# Patient Record
Sex: Female | Born: 2000 | Hispanic: Yes | Marital: Single
Health system: Southern US, Community
[De-identification: ages and names within clinical notes are randomized; demographics above are authoritative.]

## PROBLEM LIST (undated history)

## (undated) DIAGNOSIS — F332 Major depressive disorder, recurrent severe without psychotic features: Secondary | ICD-10-CM

---
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
## 2021-04-08 ENCOUNTER — Emergency Department (HOSPITAL_COMMUNITY)
Admission: EM | Admit: 2021-04-08 | Discharge: 2021-04-08 | Disposition: A | Discharge status: Home / Self Care | Payer: Medicaid Other | Attending: Emergency Medicine | Admitting: Emergency Medicine

## 2021-04-08 ENCOUNTER — Emergency Department (HOSPITAL_COMMUNITY): Payer: Medicaid Other

## 2021-04-08 ENCOUNTER — Encounter (HOSPITAL_COMMUNITY): Payer: Self-pay | Admitting: Emergency Medicine

## 2021-04-08 ENCOUNTER — Other Ambulatory Visit: Payer: Self-pay

## 2021-04-08 DIAGNOSIS — R17 Unspecified jaundice: Secondary | ICD-10-CM | POA: Diagnosis not present

## 2021-04-08 DIAGNOSIS — K829 Disease of gallbladder, unspecified: Secondary | ICD-10-CM | POA: Insufficient documentation

## 2021-04-08 DIAGNOSIS — R7401 Elevation of levels of liver transaminase levels: Secondary | ICD-10-CM | POA: Insufficient documentation

## 2021-04-08 DIAGNOSIS — R1011 Right upper quadrant pain: Secondary | ICD-10-CM | POA: Diagnosis present

## 2021-04-08 LAB — LIPASE, BLOOD: Lipase: 31 U/L (ref 11–51)

## 2021-04-08 LAB — CBC
HCT: 41.2 % (ref 36.0–46.0)
Hemoglobin: 13.2 g/dL (ref 12.0–15.0)
MCH: 26.5 pg (ref 26.0–34.0)
MCHC: 32 g/dL (ref 30.0–36.0)
MCV: 82.7 fL (ref 80.0–100.0)
Platelets: 307 10*3/uL (ref 150–400)
RBC: 4.98 MIL/uL (ref 3.87–5.11)
RDW: 13.2 % (ref 11.5–15.5)
WBC: 8 10*3/uL (ref 4.0–10.5)
nRBC: 0 % (ref 0.0–0.2)

## 2021-04-08 LAB — COMPREHENSIVE METABOLIC PANEL
ALT: 343 U/L — ABNORMAL HIGH (ref 0–44)
AST: 159 U/L — ABNORMAL HIGH (ref 15–41)
Albumin: 4.3 g/dL (ref 3.5–5.0)
Alkaline Phosphatase: 230 U/L — ABNORMAL HIGH (ref 38–126)
Anion gap: 9 (ref 5–15)
BUN: 7 mg/dL (ref 6–20)
CO2: 25 mmol/L (ref 22–32)
Calcium: 9.2 mg/dL (ref 8.9–10.3)
Chloride: 100 mmol/L (ref 98–111)
Creatinine, Ser: 0.53 mg/dL (ref 0.44–1.00)
GFR, Estimated: >60 mL/min (ref >60)
Glucose, Bld: 104 mg/dL — ABNORMAL HIGH (ref 70–99)
Potassium: 3.8 mmol/L (ref 3.5–5.1)
Sodium: 134 mmol/L — ABNORMAL LOW (ref 135–145)
Total Bilirubin: 2.8 mg/dL — ABNORMAL HIGH (ref 0.3–1.2)
Total Protein: 8.1 g/dL (ref 6.5–8.1)

## 2021-04-08 LAB — I-STAT BETA HCG BLOOD, ED (MC, WL, AP ONLY): I-stat hCG, quantitative: <5 m[IU]/mL (ref <5)

## 2021-04-08 MED ORDER — ONDANSETRON HCL 4 MG/2ML IJ SOLN
4.0000 mg | Freq: Once | INTRAMUSCULAR | Status: AC
  Administered 2021-04-08: 4 mg via INTRAVENOUS
  Filled 2021-04-08: qty 2

## 2021-04-08 MED ORDER — HYDROCODONE-ACETAMINOPHEN 5-325 MG PO TABS: 1.0000 | ORAL_TABLET | ORAL | 0 refills | Status: DC | PRN

## 2021-04-08 MED ORDER — SODIUM CHLORIDE 0.9 % IV SOLN
INTRAVENOUS | Status: DC
Start: 2021-04-08 — End: 2021-04-09

## 2021-04-08 MED ORDER — HYDROCODONE-ACETAMINOPHEN 5-325 MG PO TABS
1.0000 | ORAL_TABLET | Freq: Once | ORAL | Status: AC
  Administered 2021-04-08: 1 via ORAL
  Filled 2021-04-08: qty 1

## 2021-04-08 MED ORDER — FENTANYL CITRATE PF 50 MCG/ML IJ SOSY
50.0000 ug | PREFILLED_SYRINGE | Freq: Once | INTRAMUSCULAR | Status: AC
  Administered 2021-04-08: 50 ug via INTRAVENOUS
  Filled 2021-04-08: qty 1

## 2021-04-08 NOTE — ED Triage Notes (Signed)
Pt reports issues with her gallbladder. Pt reports being seen in [place] for same and referred to follow up with surgeon. Pt reports pain unbearable to wait at this time ?

## 2021-04-08 NOTE — Discharge Instructions (Addendum)
The testing indicates that you have gallstones and some mild liver inflammation.  We are giving you a prescription for pain medicine to help treat your symptoms.  Try to decrease fat intake in your diet which will help decrease episodes of gallbladder pain.  Follow-up with Dr. Dema Severin as scheduled on April 14, 2021, for further care and treatment of the gallbladder disease.  Return here, if needed for problems. ?

## 2021-04-08 NOTE — ED Provider Notes (Signed)
?[place] DEPT ?Provider Note ? ? ?CSN: [id number] ?Arrival date & time: 04/08/21  1519 ? ?  ? ?History ? ?Chief Complaint  ?Patient presents with  ? Abdominal Pain  ? ? ?Samantha Edwards is a 21 y.o. female. ? ?HPI ?She reports right upper quadrant abdominal pain present for 2 months and worsening despite taking hydrocodone prescribed 2 months ago.  She states at that time she was told she has stones in her gallbladder.  She has not seen a surgeon yet for operative management.  She states she has had this problem for quite some time, before the episode 2 months ago.  She denies fever, cough, diarrhea or dizziness.  She is had trouble tolerating food and had multiple episodes of vomiting over the last 4 days.  She has no other current complaints.  She has a follow-up appointment scheduled general surgery for 04/14/2021. ?  ? ?Home Medications ?Prior to Admission medications   ?Medication Sig Start Date End Date Taking? Authorizing Provider  ?HYDROcodone-acetaminophen (NORCO) 5-325 MG tablet Take 1 tablet by mouth every 4 (four) hours as needed for moderate pain. 04/08/21  Yes Daleen Bo, MD  ?   ? ?Allergies    ?Patient has no allergy information on record.   ? ?Review of Systems   ?Review of Systems ? ?Physical Exam ?Updated Vital Signs ?BP (!) 96/53   Pulse 75   Temp 98.6 ?F (37 ?C) (Oral)   Resp 14   SpO2 99%  ?Physical Exam ?Vitals and nursing note reviewed.  ?Constitutional:   ?   Appearance: She is well-developed. She is not ill-appearing.  ?HENT:  ?   Head: Normocephalic and atraumatic.  ?   Right Ear: External ear normal.  ?   Left Ear: External ear normal.  ?Eyes:  ?   Conjunctiva/sclera: Conjunctivae normal.  ?   Pupils: Pupils are equal, round, and reactive to light.  ?Neck:  ?   Trachea: Phonation normal.  ?Cardiovascular:  ?   Rate and Rhythm: Normal rate and regular rhythm.  ?   Heart sounds: Normal heart sounds.  ?Pulmonary:  ?   Effort: Pulmonary effort is  normal.  ?   Breath sounds: Normal breath sounds.  ?Abdominal:  ?   General: There is no distension.  ?   Palpations: Abdomen is soft.  ?   Tenderness: There is abdominal tenderness (Right upper quadrant, mild).  ?Musculoskeletal:     ?   General: Normal range of motion.  ?   Cervical back: Normal range of motion and neck supple.  ?Skin: ?   General: Skin is warm and dry.  ?Neurological:  ?   Mental Status: She is alert and oriented to person, place, and time.  ?   Cranial Nerves: No cranial nerve deficit.  ?   Sensory: No sensory deficit.  ?   Motor: No abnormal muscle tone.  ?   Coordination: Coordination normal.  ?Psychiatric:     ?   Mood and Affect: Mood normal.     ?   Behavior: Behavior normal.     ?   Thought Content: Thought content normal.     ?   Judgment: Judgment normal.  ? ? ?ED Results / Procedures / Treatments   ?Labs ?(all labs ordered are listed, but only abnormal results are displayed) ?Labs Reviewed  ?COMPREHENSIVE METABOLIC PANEL - Abnormal; Notable for the following components:  ?    Result Value  ? Sodium 134 (*)   ?  Glucose, Bld 104 (*)   ? AST 159 (*)   ? ALT 343 (*)   ? Alkaline Phosphatase 230 (*)   ? Total Bilirubin 2.8 (*)   ? All other components within normal limits  ?LIPASE, BLOOD  ?CBC  ?URINALYSIS, ROUTINE W REFLEX MICROSCOPIC  ?I-STAT BETA HCG BLOOD, ED ([facility], [facility], [facility] ONLY)  ? ? ?EKG ?None ? ?Radiology ?US Abdomen Complete ? ?Result Date: 04/08/2021 ?CLINICAL DATA:  Pain, vomiting EXAM: ABDOMEN ULTRASOUND COMPLETE COMPARISON:  Ultrasound 04/06/2021 FINDINGS: Gallbladder: There are multiple intraluminal gallstones, largest measuring up to 6 mm. The gallbladder is mildly distended. No wall thickening or pericholecystic fluid. A positive sonographic Percell Miller sign is reported by the sonographer. Common bile duct: Diameter: 14 mm, abnormal. No intraductal obstructing lesion identified. Liver: No focal lesion identified. Within normal limits in parenchymal echogenicity. Portal vein is patent  on color Doppler imaging with normal direction of blood flow towards the liver. IVC: No abnormality visualized. Pancreas: Visualized portion unremarkable. Spleen: Size and appearance within normal limits. Right Kidney: Length: 9.4. Echogenicity within normal limits. No mass or hydronephrosis visualized. Left Kidney: Length: 8 point. Echogenicity within normal limits. No mass or hydronephrosis visualized. Abdominal aorta: No aneurysm visualized. Other findings: Limited exam due to patient position and pain. IMPRESSION: Dilated common bile duct measuring up to 14 mm without intraductal obstructing lesion visible sonographically. There are gallstones within the gallbladder. Recommend correlation with LFTs for findings suggesting biliary obstruction and consideration of MRCP/ERCP. A positive sonographic Percell Miller sign was reported by the sonographer, however the gallbladder is only mildly distended and there is no wall thickening or pericholecystic fluid. Acute cholecystitis is unlikely based on the sonographic appearance. Electronically Signed   By: Maurine Simmering M.D.   On: 04/08/2021 17:12   ? ?Procedures ?Procedures  ? ? ?Medications Ordered in ED ?Medications  ?0.9 %  sodium chloride infusion ( Intravenous New Bag/Given 04/08/21 1722)  ?HYDROcodone-acetaminophen (NORCO/VICODIN) 5-325 MG per tablet 1 tablet (has no administration in time range)  ?fentaNYL (SUBLIMAZE) injection 50 mcg (50 mcg Intravenous Given 04/08/21 1712)  ?ondansetron [facility]) injection 4 mg (4 mg Intravenous Given 04/08/21 1711)  ? ? ?ED Course/ Medical Decision Making/ A&P ?Clinical Course as of 04/08/21 2240  ?Wed Apr 08, 2021  ?2152 Case discussed with Dr. Rosendo Gros from general surgery.  He recommends outpatient follow-up based on findings and improvement after treatment [EW]  ?  ?Clinical Course User Index ?[EW] Daleen Bo, MD  ? ?                        ?Medical Decision Making ?Patient presenting with chronic right upper quadrant abdominal pain  now with vomiting and increased pain, intolerance to food.  She thinks that she has gallstones. ? ?Problems Addressed: ?Right upper quadrant abdominal pain: chronic illness or injury ?   Details: Evaluation consistent with gallbladder complications ? ?Amount and/or Complexity of Data Reviewed ?Independent Historian:  ?   Details: She is a cogent historian ?Labs: ordered. ?   Details: CBC, c-Met, lipase ?Radiology: ordered and independent interpretation performed. ?   Details: Abdominal ultrasound ?Discussion of management or test interpretation with external provider(s): Case discussed with general surgery, Dr. Rosendo Gros.  He recommends outpatient follow-up for gallbladder disease.  He does not think that the ultrasound findings require further intervention at this time. ? ?Risk ?Prescription drug management. ?Decision regarding hospitalization. ?Risk Details: Patient with chronic symptoms of gallstones, without signs for cholecystitis or significant inflammatory  process that would require hospitalization or further interventions at this time.  Patient is comfortable after treatment in the ED and stable for discharge.  She already has a follow-up appointment scheduled for next week. ? ? ? ? ? ? ? ? ? ? ?Final Clinical Impression(s) / ED Diagnoses ?Final diagnoses:  ?Right upper quadrant abdominal pain  ?Gallbladder disease  ?Transaminitis  ?Hyperbilirubinemia  ? ? ?Rx / DC Orders ?ED Discharge Orders   ? ?      Ordered  ?  HYDROcodone-acetaminophen (NORCO) 5-325 MG tablet  Every 4 hours PRN       ? 04/08/21 2225  ? ?  ?  ? ?  ? ? ?  ?Daleen Bo, MD ?04/08/21 2241 ? ?

## 2021-06-18 HISTORY — PX: CHOLECYSTECTOMY: SHX55

## 2021-12-25 ENCOUNTER — Ambulatory Visit: Payer: Medicaid Other

## 2022-01-01 ENCOUNTER — Ambulatory Visit (INDEPENDENT_AMBULATORY_CARE_PROVIDER_SITE_OTHER): Payer: Medicaid Other

## 2022-01-01 VITALS — BP 110/70 | HR 80 | Wt 155.0 lb

## 2022-01-01 DIAGNOSIS — Z3201 Encounter for pregnancy test, result positive: Secondary | ICD-10-CM

## 2022-01-01 DIAGNOSIS — N912 Amenorrhea, unspecified: Secondary | ICD-10-CM

## 2022-01-01 DIAGNOSIS — Z3401 Encounter for supervision of normal first pregnancy, first trimester: Secondary | ICD-10-CM

## 2022-01-01 MED ORDER — VITAFOL GUMMIES 3.33-0.333-34.8 MG PO CHEW: 1.0000 | CHEWABLE_TABLET | Freq: Every day | ORAL | 12 refills | Status: DC

## 2022-01-01 NOTE — Progress Notes (Addendum)
Ms. Bahena presents today for UPT. She has no unusual complaints. LMP: 11/21/21     OBJECTIVE: Appears well, in no apparent distress.  OB History     Gravida  1   Para      Term      Preterm      AB      Living         SAB      IAB      Ectopic      Multiple      Live Births             Home UPT Result: positive  In-Office UPT result:positive  I have reviewed the patient's medical, obstetrical, social, and family histories, and medications.   ASSESSMENT: Positive pregnancy test  PLAN Prenatal care to be completed at:  [facility]  Enroll in Babyscripts  PNV's sent to the pharmacy today.

## 2022-01-18 NOTE — L&D Delivery Note (Signed)
Patient complete and pushing. SVD of viable female infant over intact perineum. Infant delivered to mom's abdomen. Delayed cord clamping x 1 minute. Cord clamped x 2, cut. Spontaneous cry heard. Weight and Apgars pending. Cord blood obtained. Placenta delivered spontaneously and intact. LUS cleared of clot Vagina inspected.1st degree and left peri-urethral lacerations noted. Repaired with 3-0 Vicryl with  a figure of eight in each location EBL: 277 cc Anesthesia: local

## 2022-02-05 ENCOUNTER — Ambulatory Visit (INDEPENDENT_AMBULATORY_CARE_PROVIDER_SITE_OTHER): Payer: Medicaid Other

## 2022-02-05 ENCOUNTER — Other Ambulatory Visit (HOSPITAL_COMMUNITY)
Admission: RE | Admit: 2022-02-05 | Discharge: 2022-02-05 | Disposition: A | Discharge status: Home / Self Care | Payer: Medicaid Other | Source: Ambulatory Visit | Attending: Obstetrics | Admitting: Obstetrics

## 2022-02-05 VITALS — BP 100/66 | HR 101 | Ht 61.0 in | Wt 152.4 lb

## 2022-02-05 DIAGNOSIS — Z1332 Encounter for screening for maternal depression: Secondary | ICD-10-CM | POA: Diagnosis not present

## 2022-02-05 DIAGNOSIS — Z3481 Encounter for supervision of other normal pregnancy, first trimester: Secondary | ICD-10-CM

## 2022-02-05 DIAGNOSIS — Z3480 Encounter for supervision of other normal pregnancy, unspecified trimester: Secondary | ICD-10-CM | POA: Insufficient documentation

## 2022-02-05 DIAGNOSIS — Z23 Encounter for immunization: Secondary | ICD-10-CM

## 2022-02-05 DIAGNOSIS — O3680X0 Pregnancy with inconclusive fetal viability, not applicable or unspecified: Secondary | ICD-10-CM

## 2022-02-05 DIAGNOSIS — Z3A12 12 weeks gestation of pregnancy: Secondary | ICD-10-CM

## 2022-02-05 DIAGNOSIS — Z3A15 15 weeks gestation of pregnancy: Secondary | ICD-10-CM

## 2022-02-05 MED ORDER — PROMETHAZINE HCL 25 MG PO TABS: 25.0000 mg | ORAL_TABLET | Freq: Four times a day (QID) | ORAL | 2 refills | Status: DC | PRN

## 2022-02-05 MED ORDER — VITAFOL GUMMIES 3.33-0.333-34.8 MG PO CHEW: 3.0000 | CHEWABLE_TABLET | Freq: Every day | ORAL | 11 refills | Status: DC

## 2022-02-05 MED ORDER — BLOOD PRESSURE KIT DEVI: 1.0000 | 0 refills | Status: DC

## 2022-02-05 NOTE — Progress Notes (Signed)
New OB Intake  I connected with Samantha Edwards  on 02/05/22 at 10:15 AM EST by in person and verified that I am speaking with the correct person using two identifiers. Nurse is located at [facility] and pt is located at [place].  I discussed the limitations, risks, security and privacy concerns of performing an evaluation and management service by telephone and the availability of in person appointments. I also discussed with the patient that there may be a patient responsible charge related to this service. The patient expressed understanding and agreed to proceed.  I explained I am completing New OB Intake today. We discussed EDD of 08/15/22 that is based on first trimester u/s. Pt is G3/P1011. I reviewed her allergies, medications, Medical/Surgical/OB history, and appropriate screenings. I informed her of [facility] services. [facility] information placed in AVS. Based on history, this is a low risk pregnancy.  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 Patient given information for [facility] Healthy Baby website for more information about [facility] and [place]. Patient is interested in water birth. Offered upcoming OB visit with CNM to discuss further.  MyChart/Babyscripts MyChart access verified. I explained pt will have some visits in office and some virtually. Babyscripts instructions given and order placed. Patient verifies receipt of registration text/e-mail. Account successfully created and app downloaded.  Blood Pressure Cuff/Weight Scale Blood pressure cuff ordered for  patient to pick-up from [employer]. Explained after first prenatal appt pt will check weekly and document in 59. Patient does have weight scale.  Anatomy US Explained first scheduled [place] will be around 19 weeks. Dating and viability u/s performed today. Anatomy US TBD.  Labs Discussed Johnsie Cancel genetic screening with patient. Would like both Panorama and Horizon drawn at new OB visit. Routine prenatal labs needed.  COVID Vaccine Patient has not had COVID vaccine.   Is patient a CenteringPregnancy candidate?  Declined Declined due to Group setting Not a candidate due to  If accepted,   Social Determinants of Health Food Insecurity: Patient denies food insecurity. WIC Referral: Patient is interested in referral to [facility].  Transportation: Patient denies transportation needs. Childcare: Discussed no children allowed at ultrasound appointments. Offered childcare services; patient declines childcare services at this time.  First visit review I reviewed new OB appt with patient. I explained they will have a provider visit that includes pap smear and discuss plan of care for pregnancy. Explained pt will be seen by Gale Journey at first visit; encounter routed to appropriate provider. Explained that patient will be seen by pregnancy navigator following visit with provider.   Lucianne Lei, RN 02/05/2022  10:14 AM

## 2022-02-05 NOTE — Addendum Note (Signed)
Addended by: Lucianne Lei on: 02/05/2022 11:31 AM   Modules accepted: Orders

## 2022-02-07 LAB — CULTURE, OB URINE

## 2022-02-07 LAB — URINE CULTURE, OB REFLEX

## 2022-02-08 LAB — CERVICOVAGINAL ANCILLARY ONLY
Chlamydia: NEGATIVE
Comment: NEGATIVE
Comment: NEGATIVE
Comment: NORMAL
Neisseria Gonorrhea: NEGATIVE
Trichomonas: NEGATIVE

## 2022-02-10 LAB — CBC/D/PLT+RPR+RH+ABO+RUBIGG...
Antibody Screen: NEGATIVE
Basophils Absolute: 0 10*3/uL (ref 0.0–0.2)
Basos: 0 %
EOS (ABSOLUTE): 0.1 10*3/uL (ref 0.0–0.4)
Eos: 1 %
HCV Ab: NONREACTIVE
HIV Screen 4th Generation wRfx: NONREACTIVE
Hematocrit: 39.9 % (ref 34.0–46.6)
Hemoglobin: 12.7 g/dL (ref 11.1–15.9)
Hepatitis B Surface Ag: NEGATIVE
Immature Grans (Abs): 0 10*3/uL (ref 0.0–0.1)
Immature Granulocytes: 0 %
Lymphocytes Absolute: 1.4 10*3/uL (ref 0.7–3.1)
Lymphs: 20 %
MCH: 24.9 pg — ABNORMAL LOW (ref 26.6–33.0)
MCHC: 31.8 g/dL (ref 31.5–35.7)
MCV: 78 fL — ABNORMAL LOW (ref 79–97)
Monocytes Absolute: 0.3 10*3/uL (ref 0.1–0.9)
Monocytes: 4 %
Neutrophils Absolute: 5.5 10*3/uL (ref 1.4–7.0)
Neutrophils: 75 %
Platelets: 306 10*3/uL (ref 150–450)
RBC: 5.11 x10E6/uL (ref 3.77–5.28)
RDW: 15.2 % (ref 11.7–15.4)
RPR Ser Ql: REACTIVE — AB
Rh Factor: POSITIVE
Rubella Antibodies, IGG: 2.12 index (ref >0.99)
WBC: 7.3 10*3/uL (ref 3.4–10.8)

## 2022-02-10 LAB — HCV INTERPRETATION

## 2022-02-10 LAB — RPR, QUANT+TP ABS (REFLEX)
Rapid Plasma Reagin, Quant: 1:64 {titer} — ABNORMAL HIGH
T Pallidum Abs: NONREACTIVE

## 2022-02-11 LAB — PANORAMA PRENATAL TEST FULL PANEL:PANORAMA TEST PLUS 5 ADDITIONAL MICRODELETIONS: FETAL FRACTION: 9.8

## 2022-02-17 LAB — HORIZON CUSTOM: REPORT SUMMARY: NEGATIVE

## 2022-02-23 ENCOUNTER — Other Ambulatory Visit (HOSPITAL_COMMUNITY)
Admission: RE | Admit: 2022-02-23 | Discharge: 2022-02-23 | Disposition: A | Discharge status: Home / Self Care | Payer: Medicaid Other | Source: Ambulatory Visit | Attending: Obstetrics and Gynecology | Admitting: Obstetrics and Gynecology

## 2022-02-23 ENCOUNTER — Encounter: Payer: Self-pay | Admitting: Obstetrics and Gynecology

## 2022-02-23 ENCOUNTER — Ambulatory Visit (INDEPENDENT_AMBULATORY_CARE_PROVIDER_SITE_OTHER): Payer: Medicaid Other | Admitting: Obstetrics and Gynecology

## 2022-02-23 VITALS — BP 103/66 | HR 91 | Wt 154.0 lb

## 2022-02-23 DIAGNOSIS — Z124 Encounter for screening for malignant neoplasm of cervix: Secondary | ICD-10-CM | POA: Insufficient documentation

## 2022-02-23 DIAGNOSIS — Z3482 Encounter for supervision of other normal pregnancy, second trimester: Secondary | ICD-10-CM | POA: Diagnosis not present

## 2022-02-23 DIAGNOSIS — A530 Latent syphilis, unspecified as early or late: Secondary | ICD-10-CM | POA: Diagnosis not present

## 2022-02-23 DIAGNOSIS — Z8659 Personal history of other mental and behavioral disorders: Secondary | ICD-10-CM | POA: Diagnosis not present

## 2022-02-23 DIAGNOSIS — Z3480 Encounter for supervision of other normal pregnancy, unspecified trimester: Secondary | ICD-10-CM

## 2022-02-23 DIAGNOSIS — Z3A15 15 weeks gestation of pregnancy: Secondary | ICD-10-CM

## 2022-02-23 NOTE — Progress Notes (Signed)
Subjective:   Samantha Edwards is a 22 y.o. G3P1011 at [redacted]w[redacted]d by early ultrasound being seen today for her first obstetrical visit.  Her obstetrical history is significant for  n/a . Patient does intend to breast feed. Pregnancy history fully reviewed.  Patient reports she is doing well overall. Nervous about her +RPR.  Discussed her history of depression with a suicide attempt by drug overdose. She reports that this occurred around 2018. She no longer follows with psychiatry/therapist and has not been on an SSRI for years. She reports that her mood has been good and she has had no issues with depression.  Denies any complications with her previous pregnancy, labor or delivery.  HISTORY: OB History  Gravida Para Term Preterm AB Living  3 1 1  0 1 1  SAB IAB Ectopic Multiple Live Births  1 0 0 0 1    # Outcome Date GA Lbr Len/2nd Weight Sex Delivery Anes PTL Lv  3 Current           2 SAB 05/2021          1 Term 12/07/20   6 lb 5 oz (2.863 kg) F Vag-Spont   LIV    Last pap smear: never  Past Medical History:  Diagnosis Date   MDD (major depressive disorder), recurrent severe, without psychosis ([place]) 11/09/2016   Suicide attempt by drug ingestion ([place]) 11/08/2016   Past Surgical History:  Procedure Laterality Date   CHOLECYSTECTOMY  06/2021   No family history on file. Social History   Tobacco Use   Smoking status: Never    Passive exposure: Never   Smokeless tobacco: Never  Vaping Use   Vaping Use: Never used  Substance Use Topics   Alcohol use: Not Currently    Comment: not since confirmed pregnancy   Drug use: Not Currently    Types: Marijuana    Comment: not since confirmed pregnancy   No Known Allergies Current Outpatient Medications on File Prior to Visit  Medication Sig Dispense Refill   Blood Pressure Monitoring (BLOOD PRESSURE KIT) DEVI 1 kit by Does not apply route once a week. 1 each 0   Prenatal Vit-Fe Phos-FA-Omega (VITAFOL GUMMIES)  3.33-0.333-34.8 MG CHEW Chew 3 tablets by mouth daily. 90 tablet 11   promethazine (PHENERGAN) 25 MG tablet Take 1 tablet (25 mg total) by mouth every 6 (six) hours as needed for nausea or vomiting. 30 tablet 2   No current facility-administered medications on file prior to visit.   Exam   Vitals:   02/23/22 1523  BP: 103/66  Pulse: 91  Weight: 154 lb (69.9 kg)   Fetal Heart Rate (bpm): 160  General:  Alert, oriented and cooperative. Patient is in no acute distress.  Breast: Deferred  Cardiovascular: Normal heart rate noted  Respiratory: Normal respiratory effort, no problems with respiration noted  Abdomen: Soft, gravid, appropriate for gestational age.  Pain/Pressure: Absent     Pelvic: Performed in presence of a chaperone. NEFG. Cervical ectropion. Normal cervix.         Extremities: Normal range of motion.  Edema: None  Mental Status: Normal mood and affect. Normal behavior. Normal judgment and thought content.    Assessment:   Pregnancy: G3P1011 Patient Active Problem List   Diagnosis Date Noted   Positive RPR test 02/23/2022   Supervision of other normal pregnancy, antepartum 02/05/2022   History of depression with suicide attempt 11/08/2016   Plan:  Supervision of other normal pregnancy, antepartum  Initial labs previously drawn. Reviewed results with patient today. Continue prenatal vitamins. Genetic Screening discussed:NIPS/Horizon already completed and normal. Declined AFP today Ultrasound discussed; fetal anatomic survey: ordered. Problem list reviewed and updated. The nature of [place] with multiple MDs and other Advanced Practice Providers was explained to patient; also emphasized that residents, students are part of our team. Routine obstetric precautions reviewed. -     [place] MFM OB COMP + 14 WK; Future  2. Cervical cancer screening -     Cytology - PAP( [place])  3. Positive RPR test Negative TPPA. Office  contacted by state surveillance and repeat RPR was recommended. Ordered today. -     RPR  4. History of depression No current medications, not following with psych/therapist Discussed elevated risk of antenatal & postpartum depression. Discussed options for [facility] support. Pt able to identify concerning symptoms and when to call our office  Return in about 4 weeks (around 03/23/2022) for return OB & anatomy US.  Gale Journey, MD [place], [facility] for [employer], [place]

## 2022-02-23 NOTE — Progress Notes (Signed)
Patient presents for NOB. Needs repeat RPR per state.  Pap due today.  Declines AFP.

## 2022-02-24 LAB — RPR: RPR Ser Ql: NONREACTIVE

## 2022-02-26 LAB — CYTOLOGY - PAP: Diagnosis: NEGATIVE

## 2022-03-23 ENCOUNTER — Encounter: Payer: Medicaid Other | Admitting: Obstetrics and Gynecology

## 2022-03-24 ENCOUNTER — Ambulatory Visit: Payer: Medicaid Other | Admitting: *Deleted

## 2022-03-24 ENCOUNTER — Ambulatory Visit: Payer: Medicaid Other | Attending: Obstetrics and Gynecology

## 2022-03-24 ENCOUNTER — Encounter: Payer: Self-pay | Admitting: *Deleted

## 2022-03-24 VITALS — BP 112/59 | HR 73

## 2022-03-24 DIAGNOSIS — Z3A19 19 weeks gestation of pregnancy: Secondary | ICD-10-CM | POA: Diagnosis not present

## 2022-03-24 DIAGNOSIS — Z3689 Encounter for other specified antenatal screening: Secondary | ICD-10-CM | POA: Insufficient documentation

## 2022-03-24 DIAGNOSIS — Z3480 Encounter for supervision of other normal pregnancy, unspecified trimester: Secondary | ICD-10-CM | POA: Diagnosis present

## 2022-03-24 DIAGNOSIS — Z363 Encounter for antenatal screening for malformations: Secondary | ICD-10-CM | POA: Diagnosis not present

## 2022-03-25 ENCOUNTER — Ambulatory Visit (INDEPENDENT_AMBULATORY_CARE_PROVIDER_SITE_OTHER): Payer: Medicaid Other | Admitting: Obstetrics and Gynecology

## 2022-03-25 ENCOUNTER — Encounter: Payer: Self-pay | Admitting: Obstetrics and Gynecology

## 2022-03-25 VITALS — BP 110/62 | HR 87 | Wt 158.2 lb

## 2022-03-25 DIAGNOSIS — R040 Epistaxis: Secondary | ICD-10-CM

## 2022-03-25 DIAGNOSIS — Z3480 Encounter for supervision of other normal pregnancy, unspecified trimester: Secondary | ICD-10-CM

## 2022-03-25 DIAGNOSIS — Z3482 Encounter for supervision of other normal pregnancy, second trimester: Secondary | ICD-10-CM

## 2022-03-25 DIAGNOSIS — Z3A19 19 weeks gestation of pregnancy: Secondary | ICD-10-CM

## 2022-03-25 MED ORDER — PRENATAL VITAMINS 28-0.8 MG PO TABS: 1.0000 | ORAL_TABLET | Freq: Every day | ORAL | 3 refills | Status: DC

## 2022-03-25 NOTE — Progress Notes (Signed)
   LOW-RISK PREGNANCY OFFICE VISIT Patient name: Samantha Edwards MRN [id number]  Date of birth: December 19, 2000 Chief Complaint:   Routine Prenatal Visit  History of Present Illness:   Samantha Edwards is a 22 y.o. G92P1011 female at 66w4dwith an Estimated Date of Delivery: 08/15/22 being seen today for ongoing management of a low-risk pregnancy.  Today she reports  nose bleeds . Contractions: Not present. Vag. Bleeding: None.  Movement: Present. denies leaking of fluid. Review of Systems:   Pertinent items are noted in HPI Denies abnormal vaginal discharge w/ itching/odor/irritation, headaches, visual changes, shortness of breath, chest pain, abdominal pain, severe nausea/vomiting, or problems with urination or bowel movements unless otherwise stated above. Pertinent History Reviewed:  Reviewed past medical,surgical, social, obstetrical and family history.  Reviewed problem list, medications and allergies. Physical Assessment:   Vitals:   03/25/22 1556  BP: 110/62  Pulse: 87  Weight: 158 lb 3.2 oz (71.8 kg)  Body mass index is 29.89 kg/m.        Physical Examination:   General appearance: Well appearing, and in no distress  Mental status: Alert, oriented to person, place, and time  Skin: Warm & dry  Cardiovascular: Normal heart rate noted  Respiratory: Normal respiratory effort, no distress  Abdomen: Soft, gravid, nontender  Pelvic: Cervical exam deferred         Extremities: Edema: None  Fetal Status: Fetal Heart Rate (bpm): 154   Movement: Present    No results found for this or any previous visit (from the past 24 hour(s)).  Assessment & Plan:  1) Low-risk pregnancy G3P1011 at 15w4dith an Estimated Date of Delivery: 08/15/22   2) Supervision of other normal pregnancy, antepartum - Rx: Prenatal Vit-Fe Fumarate-FA (PRENATAL VITAMINS) 28-0.8 MG TABS; Take 1 tablet by mouth daily.  Dispense: 60 tablet; Refill: 3  3) Frequent epistaxis - Advised can be a normal  variation of pregnancy - Advised when occurs to hold head forward and not back, pinch bridge of nose and rub keys down the back of neck to help constrict blood vessels in nose  4) [redacted] weeks gestation of pregnancy     Meds:  Meds ordered this encounter  Medications   Prenatal Vit-Fe Fumarate-FA (PRENATAL VITAMINS) 28-0.8 MG TABS    Sig: Take 1 tablet by mouth daily.    Dispense:  60 tablet    Refill:  3    Order Specific Question:   Supervising Provider    Answer:   PRDonnamae [id number] Labs/procedures today: none  Plan:  Continue routine obstetrical care   Reviewed: Preterm labor symptoms and general obstetric precautions including but not limited to vaginal bleeding, contractions, leaking of fluid and fetal movement were reviewed in detail with the patient.  All questions were answered. Has home bp cuff. Check bp weekly, let usKoreanow if >140/90.   Follow-up: Return in about 4 weeks (around 04/22/2022) for Return OB visit.  No orders of the defined types were placed in this encounter.  RoLaury DeepSN, CNM 03/25/2022 4:35 PM

## 2022-03-25 NOTE — Progress Notes (Signed)
Pt presents for ROB visit. Pt c/o nose bleeds. Pt requesting prenatal tablets instead of gummies. Declines AFP testing.

## 2022-04-22 ENCOUNTER — Encounter: Payer: Self-pay | Admitting: Student

## 2022-04-22 ENCOUNTER — Ambulatory Visit (INDEPENDENT_AMBULATORY_CARE_PROVIDER_SITE_OTHER): Payer: Medicaid Other | Admitting: Student

## 2022-04-22 VITALS — BP 97/53 | HR 82 | Wt 166.6 lb

## 2022-04-22 DIAGNOSIS — Z3A23 23 weeks gestation of pregnancy: Secondary | ICD-10-CM

## 2022-04-22 DIAGNOSIS — Z3480 Encounter for supervision of other normal pregnancy, unspecified trimester: Secondary | ICD-10-CM

## 2022-04-22 NOTE — Progress Notes (Signed)
   PRENATAL VISIT NOTE  Subjective:  Samantha Edwards is a 22 y.o. G3P1011 at [redacted]w[redacted]d being seen today for ongoing prenatal care.  She is currently monitored for the following issues for this low-risk pregnancy and has History of depression with suicide attempt; Supervision of other normal pregnancy, antepartum; and Positive RPR test on their problem list.  Patient reports no complaints.  Contractions: Not present. Vag. Bleeding: None.  Movement: Present. Denies leaking of fluid.   The following portions of the patient's history were reviewed and updated as appropriate: allergies, current medications, past family history, past medical history, past social history, past surgical history and problem list.   Objective:   Vitals:   04/22/22 1600  BP: (!) 97/53  Pulse: 82  Weight: 166 lb 9.6 oz (75.6 kg)    Fetal Status: Fetal Heart Rate (bpm): 161 Fundal Height: 23 cm Movement: Present     General:  Alert, oriented and cooperative. Patient is in no acute distress.  Skin: Skin is warm and dry. No rash noted.   Cardiovascular: Normal heart rate noted  Respiratory: Normal respiratory effort, no problems with respiration noted  Abdomen: Soft, gravid, appropriate for gestational age.  Pain/Pressure: Present     Pelvic: Cervical exam deferred        Extremities: Normal range of motion.  Edema: None  Mental Status: Normal mood and affect. Normal behavior. Normal judgment and thought content.   Assessment and Plan:  Pregnancy: G3P1011 at [redacted]w[redacted]d 1. Supervision of other normal pregnancy, antepartum - Reports fetal movement   2. [redacted] weeks gestation of pregnancy - continue routine care  Preterm labor symptoms and general obstetric precautions including but not limited to vaginal bleeding, contractions, leaking of fluid and fetal movement were reviewed in detail with the patient. Please refer to After Visit Summary for other counseling recommendations.   Return in 4 weeks (on 05/20/2022) for  LOB/GTT, IN-PERSON.  No future appointments.  Johnston Ebbs, NP

## 2022-05-21 ENCOUNTER — Encounter: Payer: Self-pay | Admitting: Obstetrics and Gynecology

## 2022-05-21 ENCOUNTER — Other Ambulatory Visit: Payer: Medicaid Other

## 2022-05-26 ENCOUNTER — Encounter: Payer: Self-pay | Admitting: Family Medicine

## 2022-05-26 ENCOUNTER — Ambulatory Visit (INDEPENDENT_AMBULATORY_CARE_PROVIDER_SITE_OTHER): Payer: Medicaid Other | Admitting: Family Medicine

## 2022-05-26 ENCOUNTER — Other Ambulatory Visit: Payer: Medicaid Other

## 2022-05-26 VITALS — BP 103/64 | HR 92 | Wt 171.6 lb

## 2022-05-26 DIAGNOSIS — Z3A28 28 weeks gestation of pregnancy: Secondary | ICD-10-CM

## 2022-05-26 DIAGNOSIS — Z23 Encounter for immunization: Secondary | ICD-10-CM | POA: Diagnosis not present

## 2022-05-26 DIAGNOSIS — Z1339 Encounter for screening examination for other mental health and behavioral disorders: Secondary | ICD-10-CM

## 2022-05-26 DIAGNOSIS — Z3480 Encounter for supervision of other normal pregnancy, unspecified trimester: Secondary | ICD-10-CM

## 2022-05-26 NOTE — Progress Notes (Unsigned)
Pt presents for ROB visit. Pt c/o pelvic pain and pressure. No other concerns.

## 2022-05-27 LAB — CBC
Hematocrit: 31.9 % — ABNORMAL LOW (ref 34.0–46.6)
Hemoglobin: 10.4 g/dL — ABNORMAL LOW (ref 11.1–15.9)
MCH: 25.4 pg — ABNORMAL LOW (ref 26.6–33.0)
MCHC: 32.6 g/dL (ref 31.5–35.7)
MCV: 78 fL — ABNORMAL LOW (ref 79–97)
Platelets: 239 10*3/uL (ref 150–450)
RBC: 4.1 x10E6/uL (ref 3.77–5.28)
RDW: 14.5 % (ref 11.7–15.4)
WBC: 8.7 10*3/uL (ref 3.4–10.8)

## 2022-05-27 LAB — GLUCOSE TOLERANCE, 2 HOURS W/ 1HR
Glucose, 1 hour: 117 mg/dL (ref 70–179)
Glucose, 2 hour: 49 mg/dL — ABNORMAL LOW (ref 70–152)
Glucose, Fasting: 75 mg/dL (ref 70–91)

## 2022-05-27 LAB — RPR: RPR Ser Ql: NONREACTIVE

## 2022-05-27 LAB — HIV ANTIBODY (ROUTINE TESTING W REFLEX): HIV Screen 4th Generation wRfx: NONREACTIVE

## 2022-05-27 NOTE — Progress Notes (Signed)
   PRENATAL VISIT NOTE  Subjective:  Samantha Edwards is a 22 y.o. G3P1011 at [redacted]w[redacted]d being seen today for ongoing prenatal care.  She is currently monitored for the following issues for this low-risk pregnancy and has History of depression with suicide attempt; Supervision of other normal pregnancy, antepartum; and Positive RPR test on their problem list.  Patient reports no bleeding, no contractions, no cramping, and no leaking.  Contractions: Not present. Vag. Bleeding: None.  Movement: Present. Denies leaking of fluid.   The following portions of the patient's history were reviewed and updated as appropriate: allergies, current medications, past family history, past medical history, past social history, past surgical history and problem list.   Objective:   Vitals:   05/26/22 0940  BP: 103/64  Pulse: 92  Weight: 171 lb 9.6 oz (77.8 kg)    Fetal Status: Fetal Heart Rate (bpm): 149   Movement: Present     General:  Alert, oriented and cooperative. Patient is in no acute distress.  Skin: Skin is warm and dry. No rash noted.   Cardiovascular: Normal heart rate noted  Respiratory: Normal respiratory effort, no problems with respiration noted  Abdomen: Soft, gravid, appropriate for gestational age.  Pain/Pressure: Present     Pelvic: Cervical exam deferred        Extremities: Normal range of motion.  Edema: None  Mental Status: Normal mood and affect. Normal behavior. Normal judgment and thought content.   Assessment and Plan:  Pregnancy: G3P1011 at [redacted]w[redacted]d 1. Supervision of other normal pregnancy, antepartum Continue routine prenatal care Patient complaining of signs and symptoms of round ligament pain, discussed pregnancy support belt - HIV antibody (with reflex) - RPR - Glucose Tolerance, 2 Hours w/1 Hour - CBC  2. [redacted] weeks gestation of pregnancy   Preterm labor symptoms and general obstetric precautions including but not limited to vaginal bleeding, contractions,  leaking of fluid and fetal movement were reviewed in detail with the patient. Please refer to After Visit Summary for other counseling recommendations.   No follow-ups on file.  Future Appointments  Date Time Provider Department Center  06/09/2022  3:50 PM Raelyn Mora, CNM [facility] None    Celedonio Savage, MD

## 2022-06-09 ENCOUNTER — Encounter: Payer: Medicaid Other | Admitting: Obstetrics and Gynecology

## 2022-06-17 ENCOUNTER — Encounter: Payer: Self-pay | Admitting: Medical

## 2022-06-17 ENCOUNTER — Ambulatory Visit (INDEPENDENT_AMBULATORY_CARE_PROVIDER_SITE_OTHER): Payer: Medicaid Other | Admitting: Medical

## 2022-06-17 VITALS — BP 106/63 | HR 91 | Wt 181.3 lb

## 2022-06-17 DIAGNOSIS — Z3A31 31 weeks gestation of pregnancy: Secondary | ICD-10-CM

## 2022-06-17 DIAGNOSIS — Z3480 Encounter for supervision of other normal pregnancy, unspecified trimester: Secondary | ICD-10-CM

## 2022-06-17 DIAGNOSIS — Z3483 Encounter for supervision of other normal pregnancy, third trimester: Secondary | ICD-10-CM

## 2022-06-17 NOTE — Progress Notes (Signed)
Pt reports fetal movement, denies pain.  

## 2022-06-18 NOTE — Progress Notes (Signed)
   PRENATAL VISIT NOTE  Subjective:  Samantha Edwards is a 22 y.o. G3P1011 at [redacted]w[redacted]d being seen today for ongoing prenatal care.  She is currently monitored for the following issues for this low-risk pregnancy and has History of depression with suicide attempt; Supervision of other normal pregnancy, antepartum; and Positive RPR test on their problem list.  Patient reports no complaints.  Contractions: Not present. Vag. Bleeding: None.  Movement: Present. Denies leaking of fluid.   The following portions of the patient's history were reviewed and updated as appropriate: allergies, current medications, past family history, past medical history, past social history, past surgical history and problem list.   Objective:   Vitals:   06/17/22 1523  BP: 106/63  Pulse: 91  Weight: 181 lb 4.8 oz (82.2 kg)    Fetal Status: Fetal Heart Rate (bpm): 143 Fundal Height: 32 cm Movement: Present     General:  Alert, oriented and cooperative. Patient is in no acute distress.  Skin: Skin is warm and dry. No rash noted.   Cardiovascular: Normal heart rate noted  Respiratory: Normal respiratory effort, no problems with respiration noted  Abdomen: Soft, gravid, appropriate for gestational age.  Pain/Pressure: Absent     Pelvic: Cervical exam deferred        Extremities: Normal range of motion.  Edema: Trace  Mental Status: Normal mood and affect. Normal behavior. Normal judgment and thought content.   Assessment and Plan:  Pregnancy: G3P1011 at [redacted]w[redacted]d 1. Supervision of other normal pregnancy, antepartum - normal third trimester labs reviewed  - Pt is interested in waterbirth.  No contraindications at this time per chart review/patient assessment.   - Pt has enroll in class, see CNMs for most visits in the office.  - Discussed waterbirth as option for low-risk pregnancy.    Preterm labor symptoms and general obstetric precautions including but not limited to vaginal bleeding, contractions, leaking  of fluid and fetal movement were reviewed in detail with the patient. Please refer to After Visit Summary for other counseling recommendations.   Return in about 2 weeks (around 07/01/2022) for LOB, In-Person, Midwife preferred.  Future Appointments  Date Time Provider Department Center  07/01/2022  3:30 PM Conan Bowens, MD [facility] None    Vonzella Nipple, PA-C

## 2022-07-01 ENCOUNTER — Encounter: Payer: Self-pay | Admitting: Obstetrics and Gynecology

## 2022-07-01 ENCOUNTER — Telehealth (INDEPENDENT_AMBULATORY_CARE_PROVIDER_SITE_OTHER): Payer: Medicaid Other | Admitting: Obstetrics and Gynecology

## 2022-07-01 VITALS — BP 117/81 | HR 118 | Wt 183.0 lb

## 2022-07-01 DIAGNOSIS — Z8659 Personal history of other mental and behavioral disorders: Secondary | ICD-10-CM

## 2022-07-01 DIAGNOSIS — Z3480 Encounter for supervision of other normal pregnancy, unspecified trimester: Secondary | ICD-10-CM

## 2022-07-01 DIAGNOSIS — O99891 Other specified diseases and conditions complicating pregnancy: Secondary | ICD-10-CM

## 2022-07-01 DIAGNOSIS — R55 Syncope and collapse: Secondary | ICD-10-CM

## 2022-07-01 DIAGNOSIS — O99013 Anemia complicating pregnancy, third trimester: Secondary | ICD-10-CM

## 2022-07-01 DIAGNOSIS — Z3A33 33 weeks gestation of pregnancy: Secondary | ICD-10-CM

## 2022-07-01 DIAGNOSIS — D649 Anemia, unspecified: Secondary | ICD-10-CM

## 2022-07-01 DIAGNOSIS — O09293 Supervision of pregnancy with other poor reproductive or obstetric history, third trimester: Secondary | ICD-10-CM

## 2022-07-01 MED ORDER — PANTOPRAZOLE SODIUM 20 MG PO TBEC: 20.0000 mg | DELAYED_RELEASE_TABLET | Freq: Every day | ORAL | Status: DC

## 2022-07-01 NOTE — Progress Notes (Signed)
     TELEHEALTH OBSTETRICS VISIT ENCOUNTER NOTE  Provider location: Center for [facility]   Patient location: Home  I connected with Samantha Edwards on 07/01/22 at  3:30 PM EDT by telephone at home and verified that I am speaking with the correct person using two identifiers. Of note, unable to do video encounter due to technical difficulties.    I discussed the limitations, risks, security and privacy concerns of performing an evaluation and management service by telephone and the availability of in person appointments. I also discussed with the patient that there may be a patient responsible charge related to this service. The patient expressed understanding and agreed to proceed.  Subjective:  Samantha Edwards is a 22 y.o. G3P1011 at [redacted]w[redacted]d being followed for ongoing prenatal care.  She is currently monitored for the following issues for this low-risk pregnancy and has History of depression with suicide attempt; Supervision of other normal pregnancy, antepartum; and Positive RPR test on their problem list.  Patient reports several fainting episodes. She has episodes where she loses consciousness, vision and hearing "feels weird".  Has "fainted" a few times during pregnancy.  Has not fallen or been injured due to this. Does not drink enough water. Reports fetal movement. Denies any contractions, bleeding or leaking of fluid.   The following portions of the patient's history were reviewed and updated as appropriate: allergies, current medications, past family history, past medical history, past social history, past surgical history and problem list.   Objective:  Blood pressure 117/81, pulse (!) 118, weight 183 lb (83 kg), last menstrual period 11/21/2021. General:  Alert, oriented and cooperative.   Mental Status: Normal mood and affect perceived. Normal judgment and thought content.  Rest of physical exam deferred due to type of encounter  Assessment and  Plan:  Pregnancy: G3P1011 at [redacted]w[redacted]d  1. Supervision of other normal pregnancy, antepartum  2. History of depression with suicide attempt  3. Anemia, unspecified type Has been told it is low Recheck CBC Needs to take iron  4. [redacted] weeks gestation of pregnancy  5. Syncope, unspecified syncope type Check CBC, iron studies Increase water/electrolyte intake Cards referral if no improvement in symptoms   Preterm labor symptoms and general obstetric precautions including but not limited to vaginal bleeding, contractions, leaking of fluid and fetal movement were reviewed in detail with the patient.  I discussed the assessment and treatment plan with the patient. The patient was provided an opportunity to ask questions and all were answered. The patient agreed with the plan and demonstrated an understanding of the instructions. The patient was advised to call back or seek an in-person office evaluation/go to MAU at [facility] for any urgent or concerning symptoms. Please refer to After Visit Summary for other counseling recommendations.   I provided 12 minutes of non-face-to-face time during this encounter.  Return in about 2 weeks (around 07/15/2022) for low OB.  Future Appointments  Date Time Provider Department Center  07/01/2022  3:30 PM Conan Bowens, MD [facility] None    Conan Bowens, MD Center for [facility], [facility]

## 2022-07-01 NOTE — Progress Notes (Addendum)
MyChart  ROB. C/o heartburns and fainting at the [facility] office, they checked her Hemo, it was low.

## 2022-07-04 ENCOUNTER — Encounter (HOSPITAL_COMMUNITY): Payer: Self-pay

## 2022-07-04 ENCOUNTER — Inpatient Hospital Stay (HOSPITAL_COMMUNITY)
Admission: EM | Admit: 2022-07-04 | Discharge: 2022-07-04 | Disposition: A | Discharge status: Home / Self Care | Payer: Medicaid Other | Attending: Obstetrics & Gynecology | Admitting: Obstetrics & Gynecology

## 2022-07-04 ENCOUNTER — Other Ambulatory Visit: Payer: Self-pay

## 2022-07-04 DIAGNOSIS — R200 Anesthesia of skin: Secondary | ICD-10-CM | POA: Diagnosis not present

## 2022-07-04 DIAGNOSIS — G43919 Migraine, unspecified, intractable, without status migrainosus: Secondary | ICD-10-CM | POA: Insufficient documentation

## 2022-07-04 DIAGNOSIS — Z3A34 34 weeks gestation of pregnancy: Secondary | ICD-10-CM | POA: Insufficient documentation

## 2022-07-04 DIAGNOSIS — O99353 Diseases of the nervous system complicating pregnancy, third trimester: Secondary | ICD-10-CM | POA: Insufficient documentation

## 2022-07-04 LAB — CBC WITH DIFFERENTIAL/PLATELET
Abs Immature Granulocytes: 0.06 10*3/uL (ref 0.00–0.07)
Basophils Absolute: 0 10*3/uL (ref 0.0–0.1)
Basophils Relative: 0 %
Eosinophils Absolute: 0.1 10*3/uL (ref 0.0–0.5)
Eosinophils Relative: 1 %
HCT: 32.8 % — ABNORMAL LOW (ref 36.0–46.0)
Hemoglobin: 9.9 g/dL — ABNORMAL LOW (ref 12.0–15.0)
Immature Granulocytes: 1 %
Lymphocytes Relative: 17 %
Lymphs Abs: 1.6 10*3/uL (ref 0.7–4.0)
MCH: 24.6 pg — ABNORMAL LOW (ref 26.0–34.0)
MCHC: 30.2 g/dL (ref 30.0–36.0)
MCV: 81.4 fL (ref 80.0–100.0)
Monocytes Absolute: 0.7 10*3/uL (ref 0.1–1.0)
Monocytes Relative: 8 %
Neutro Abs: 6.7 10*3/uL (ref 1.7–7.7)
Neutrophils Relative %: 73 %
Platelets: 268 10*3/uL (ref 150–400)
RBC: 4.03 MIL/uL (ref 3.87–5.11)
RDW: 15.9 % — ABNORMAL HIGH (ref 11.5–15.5)
WBC: 9.2 10*3/uL (ref 4.0–10.5)
nRBC: 0.2 % (ref 0.0–0.2)

## 2022-07-04 LAB — URINALYSIS, W/ REFLEX TO CULTURE (INFECTION SUSPECTED)
Bilirubin Urine: NEGATIVE
Glucose, UA: NEGATIVE mg/dL
Hgb urine dipstick: NEGATIVE
Ketones, ur: NEGATIVE mg/dL
Leukocytes,Ua: NEGATIVE
Nitrite: NEGATIVE
Protein, ur: NEGATIVE mg/dL
Specific Gravity, Urine: 1.015 (ref 1.005–1.030)
pH: 6 (ref 5.0–8.0)

## 2022-07-04 LAB — I-STAT CHEM 8, ED
BUN: <3 mg/dL — ABNORMAL LOW (ref 6–20)
Calcium, Ion: 1.19 mmol/L (ref 1.15–1.40)
Chloride: 104 mmol/L (ref 98–111)
Creatinine, Ser: 0.5 mg/dL (ref 0.44–1.00)
Glucose, Bld: 78 mg/dL (ref 70–99)
HCT: 29 % — ABNORMAL LOW (ref 36.0–46.0)
Hemoglobin: 9.9 g/dL — ABNORMAL LOW (ref 12.0–15.0)
Potassium: 4 mmol/L (ref 3.5–5.1)
Sodium: 136 mmol/L (ref 135–145)
TCO2: 25 mmol/L (ref 22–32)

## 2022-07-04 MED ORDER — METOCLOPRAMIDE HCL 5 MG/ML IJ SOLN: 10.0000 mg | Freq: Once | INTRAMUSCULAR | Status: DC

## 2022-07-04 MED ORDER — ACETAMINOPHEN 500 MG PO TABS: 1000.0000 mg | ORAL_TABLET | Freq: Once | ORAL | Status: DC

## 2022-07-04 MED ORDER — LACTATED RINGERS IV BOLUS: 1000.0000 mL | Freq: Once | INTRAVENOUS | Status: DC

## 2022-07-04 MED ORDER — DIPHENHYDRAMINE HCL 50 MG/ML IJ SOLN: 12.5000 mg | Freq: Once | INTRAMUSCULAR | Status: DC

## 2022-07-04 NOTE — ED Notes (Signed)
Pt transported to MAU  

## 2022-07-04 NOTE — ED Provider Notes (Signed)
[place] EMERGENCY DEPARTMENT AT [facility] Provider Note   HPI: Samantha Edwards is a 22 year old female with a past medical history as below presenting today with headache and decreased sensation over her right face.  She reports she woke up this morning with the symptoms.  She reports she is currently 8 months pregnant.  She has been feeling the baby move, no vaginal bleeding, no vaginal discharge changes, no vaginal fluid leakage.  She has not had nausea, vomiting, diarrhea.  She has not had abdominal pain.  She has not had chest pain or shortness of breath.  She has not had numbness or weakness in her arms or legs.  She does not have visual changes.  She reports her headache has been gradually improving.  She has a history of migraines in the past but has never had numbness with them.  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   Vitals:   07/04/22 1413  BP: 118/66  Pulse: 96  Resp: 17  Temp: 98.3 F (36.8 C)  SpO2: 99%    Physical Exam Vitals and nursing note reviewed.  Constitutional:      General: She is not in acute distress.    Appearance: She is well-developed. She is not ill-appearing.  Cardiovascular:     Rate and Rhythm: Normal rate and regular rhythm.     Pulses: Normal pulses.      Heart sounds: Normal heart sounds. No murmur heard.    No friction rub. No gallop.  Pulmonary:     Effort: Pulmonary effort is normal. No respiratory distress.     Breath sounds: Normal breath sounds. No stridor. No wheezing, rhonchi or rales.  Abdominal:     General: Abdomen is flat. There is no distension.     Palpations: Abdomen is soft.     Tenderness: There is no abdominal tenderness. There is no guarding or rebound.  Musculoskeletal:        General: No swelling.     Cervical back: Normal range of motion and neck supple.  Skin:    General: Skin is warm and dry.     Capillary Refill: Capillary refill takes less than 2 seconds.  Neurological:     Mental Status: She is alert.     GCS: GCS eye subscore is 4. GCS verbal subscore is 5. GCS motor subscore is 6.     Sensory: Sensation is intact. No sensory deficit.     Motor: Motor function is intact. No weakness, tremor, atrophy, abnormal muscle tone or seizure activity.     Coordination: Coordination is intact. Coordination normal. Finger-Nose-Finger Test normal.     Comments: CRANIAL NERVES: 2 (Optic) - Visual fields intact. PERRL brisk.  3/4/6 (Oculomotor, Trochlear, Abducens) -  EOM full without nystagmus 5 (Trigeminal) - sensation intact to light touch, patient endorses decreased sensation over the right face compared to left 7 (Facial) - No facial weakness or asymmetry.  8 (Vestibulococchlear) - responds to voice, auditory acuity grossly normal 9/10 (Glossopharyngeal) - symmetric palate and uvula elevation 11 (Spinal accessory) - head midline, Normal and symmetric sternocleidomastoid and trapezius strength  12 (Hypoglossal) - tongue midline   Psychiatric:        Mood and Affect: Mood normal.     Procedures  MDM:   Lab results:  Results for orders placed or performed during the hospital encounter of 07/04/22 (from the past 24 hour(s))  CBC with Differential     Status: Abnormal   Collection Time: 07/04/22  4:10 PM   Result Value Ref Range   WBC 9.2 4.0 - 10.5 K/uL   RBC 4.03 3.87 - 5.11 MIL/uL   Hemoglobin 9.9 (L) 12.0 - 15.0 g/dL   HCT 16.1 (L) 09.6 - 04.5 %   MCV 81.4 80.0 - 100.0 fL   MCH 24.6 (L) 26.0 - 34.0 pg   MCHC 30.2 30.0 - 36.0 g/dL   RDW 40.9 (H) 81.1 - 91.4 %   Platelets 268 150 - 400 K/uL   nRBC 0.2 0.0 - 0.2 %   Neutrophils Relative % 73 %   Neutro Abs 6.7 1.7 - 7.7 K/uL   Lymphocytes Relative 17 %   Lymphs Abs 1.6 0.7 - 4.0 K/uL   Monocytes Relative 8 %   Monocytes Absolute 0.7 0.1 - 1.0 K/uL   Eosinophils Relative 1 %   Eosinophils Absolute 0.1 0.0 - 0.5 K/uL   Basophils Relative 0 %   Basophils Absolute 0.0 0.0 - 0.1 K/uL   Immature Granulocytes 1 %   Abs Immature Granulocytes 0.06 0.00 - 0.07 K/uL  I-stat chem 8, ED (not at [facility], [facility] or [facility])     Status: Abnormal   Collection Time: 07/04/22  4:15 PM  Result Value Ref Range   Sodium 136 135 - 145 mmol/L   Potassium 4.0 3.5 - 5.1 mmol/L   Chloride 104 98 - 111 mmol/L   BUN <3 (L) 6 - 20 mg/dL   Creatinine, Ser 7.82 0.44 - 1.00 mg/dL   Glucose, Bld 78 70 - 99 mg/dL   Calcium, Ion 9.56 2.13 - 1.40 mmol/L   TCO2 25 22 - 32 mmol/L   Hemoglobin 9.9 (L) 12.0 - 15.0 g/dL   HCT 08.6 (L) 57.8 - 46.9 %     Key medications administered in the ER:  Medications  lactated ringers bolus 1,000 mL (has no administration in time range)  acetaminophen (TYLENOL) tablet 1,000 mg (has no administration in time range)  diphenhydrAMINE (BENADRYL) injection 12.5 mg (has no administration in time range)  metoCLOPramide (REGLAN) injection 10 mg (has no administration in time range)    Medical decision making: -Vital signs stable. Patient afebrile, hemodynamically stable, and non-toxic appearing. -Patient's presentation is most consistent with acute complicated illness / injury requiring diagnostic workup.. -Samantha Edwards is a 22 y.o. female presenting to the emergency department with headaches and facial numbness.  -Additional  history obtained from significant other bedside. -Labs collected with CBC showing baseline anemia with no other acute hematologic abnormalities.  Chem-8 without metabolic derangement.  UA is without abnormality, no UTI, no proteinuria. -HA onset was slow, not quick or thunderclap, doubt ICH.  Pt has neuro exam showing right face decreased sensation but is otherwise normal, no visual disturbance,  no dizziness/lightheadedness, doubt intracranial abnormality such as aneurysm, mass or intracranial bleed. Do not suspect vertebral artery or carotid artery dissection.  No infectious sx, no meningismus, afebrile, no ams, doubt meningitis.  No sinus ttp, doubt sinusitis.  There is nothing on hx or exam to give me c/f dental or ear etiology.  No tenderness over temporal artery, doubt temporal arteritis. No tearing or eye pain, doubt cluster HA.  Nothing in hx to concern me for CO poisoning. No hyperesthesia or rash to concern me for zoster.  Patient is pregnant however reports her headache is steadily improved and is not very severe.  Overall have a lower suspicion for venous sinus thrombosis.  Given there is no loss of sensation and otherwise reassuring exam that does not fit stroke is effusion lower suspicion for CVA.  I believe patient's presentation is consistent with atypical migraine as she has had migraines in the past.  Will give migraine cocktail.  Patient has been feeling her baby move, no vaginal bleeding, no vaginal discharge.  Patient does not have PE blood pressures and proteinuria, lower suspicion for preeclampsia.  Fetal heart tones collected and were 156.  Patient is stable for completion of workup in the MAU.  Please see their note for further details.    Medical Decision Making Amount and/or Complexity of Data Reviewed Labs: ordered. Decision-making details documented in ED Course.  Risk OTC drugs. Prescription drug management.     The plan for this patient was discussed with Dr. Posey Rea,  who voiced agreement and who oversaw evaluation and treatment of this patient.  Marta Lamas, MD Emergency Medicine, PGY-3  Note: Dragon medical dictation software was used in the creation of this note.   Clinical Impression:  1. Intractable migraine without status migrainosus, unspecified migraine type   2. [redacted] weeks gestation of pregnancy   3. Right facial numbness          Chase Caller, MD 07/04/22 2233    Glendora Score, MD 07/05/22 [phone]

## 2022-07-04 NOTE — MAU Provider Note (Signed)
History     CSN: [id number]  Arrival date and time: 07/04/22 1345   Event Date/Time   First Provider Initiated Contact with Patient 07/04/22 2046      Chief Complaint  Patient presents with   Rt sided HA   Rt sided facial twitching/numbness   Samantha Edwards , a  22 y.o. G3P1011 at [redacted]w[redacted]d presents to MAU after being transferred from the ED with complaints of right sided facial numbness and tingling that started this morning around 11am. She states that she woke up with a slight headache that has since resolved. Since waiting she states her headache has spontaneously resolved. She denies difficulty breathing, shortness of breath, difficulty swallowing or tongue swelling. She also denies visual changes, and right sided weakness to the right shoulder, arm and fingers. She states the numbness is located on the right cheek and right underneath her eye. She also reports that the right side of her lip was also "twitching" but has also spontaneously resolved. She has no pregnancy related complaints and endorses positive fetal movement.          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   Suicide attempt by drug ingestion (HCC) 11/08/2016    Past Surgical History:  Procedure Laterality Date   CHOLECYSTECTOMY  06/2021    Family History  Problem Relation Age of Onset   Diabetes Neg Hx    Hypertension Neg Hx     Social History   Tobacco Use   Smoking status: Never    Passive exposure: Never   Smokeless tobacco: Never  Vaping Use   Vaping Use: Never used  Substance Use Topics   Alcohol use: Not Currently    Comment: not since confirmed pregnancy   Drug use: Not Currently    Types: Marijuana    Comment:  not since confirmed pregnancy    Allergies: No Known Allergies  Facility-Administered Medications Prior to Admission  Medication Dose Route Frequency Provider Last Rate Last Admin   pantoprazole (PROTONIX) EC tablet 20 mg  20 mg Oral Daily Conan Bowens, MD       Medications Prior to Admission  Medication Sig Dispense Refill Last Dose   Prenatal Vit-Fe Fumarate-FA (PRENATAL VITAMINS) 28-0.8 MG TABS Take 1 tablet by mouth daily. 60 tablet 3 07/04/2022   Blood Pressure Monitoring (BLOOD PRESSURE KIT) DEVI 1 kit by Does not apply route once a week. 1 each 0    promethazine (PHENERGAN) 25 MG tablet Take 1 tablet (25 mg total) by mouth every 6 (six) hours as needed for nausea or vomiting. (Patient not taking: Reported on 04/22/2022) 30 tablet 2     Review of Systems  Constitutional:  Negative for chills, fatigue and fever.  HENT:  Negative for facial swelling, sinus pressure, sinus pain and trouble swallowing.   Eyes:  Negative for pain, discharge and visual disturbance.  Respiratory:  Negative for apnea, shortness of breath and wheezing.   Cardiovascular:  Negative for chest pain and  palpitations.  Gastrointestinal:  Negative for abdominal pain, constipation, diarrhea, nausea and vomiting.  Genitourinary:  Negative for difficulty urinating, dysuria, pelvic pain, vaginal bleeding, vaginal discharge and vaginal pain.  Musculoskeletal:  Negative for back pain, gait problem, joint swelling, neck pain and neck stiffness.  Neurological:  Positive for numbness and headaches. Negative for dizziness, seizures, facial asymmetry, speech difficulty, weakness and light-headedness.  Psychiatric/Behavioral:  Negative for suicidal ideas.    Physical Exam   Blood pressure (!) 103/59, pulse 92, temperature 97.9 F (36.6 C), temperature source Oral, resp. rate 17, height 5\' 1"  (1.549 m), weight 83.2 kg, last menstrual period 11/21/2021, SpO2 99 %.  Physical Exam Vitals and nursing note reviewed.   Constitutional:      General: She is not in acute distress.    Appearance: Normal appearance. She is not ill-appearing or toxic-appearing.  HENT:     Head: Normocephalic and atraumatic.  Eyes:     General: No visual field deficit.       Right eye: No discharge.     Extraocular Movements: Extraocular movements intact.     Conjunctiva/sclera: Conjunctivae normal.     Pupils: Pupils are equal, round, and reactive to light.  Cardiovascular:     Rate and Rhythm: Normal rate.  Pulmonary:     Effort: Pulmonary effort is normal. No respiratory distress.  Abdominal:     Comments: pregnant  Musculoskeletal:        General: No swelling. Normal range of motion.     Cervical back: Normal range of motion.  Skin:    General: Skin is warm and dry.  Neurological:     General: No focal deficit present.     Mental Status: She is alert and oriented to person, place, and time.     GCS: GCS eye subscore is 4. GCS verbal subscore is 5. GCS motor subscore is 6.     Cranial Nerves: No cranial nerve deficit or facial asymmetry.     Motor: No weakness.     Coordination: Coordination normal.     Gait: Gait normal.     Deep Tendon Reflexes: Reflexes normal.  Psychiatric:        Mood and Affect: Mood normal.        Behavior: Behavior normal.     MAU Course  Procedures   Patient Vitals for the past 24 hrs:  BP Temp Temp src Pulse Resp SpO2 Height Weight  07/04/22 1950 (!) 103/59 -- -- 92 -- 99 % -- --  07/04/22 1931 (!) 103/55 97.9 F (36.6 C) Oral 85 17 99 % 5\' 1"  (1.549 m) 83.2 kg  07/04/22 1758 98/60 98.2 F (36.8 C) Oral 80 18 100 % -- --  07/04/22 1413 118/66 98.3 F (36.8 C) Oral 96 17 99 % -- --   - BPs normal range.  - No visual changes  - Headache spontaneously resolved  - Low suspicion for PreE.  - No facial drooping, right sided weakness, difficulty breathing or swallowing, or change in speech patterns Low suspicion for stroke.  - Low suspicion for bells palsy  - Was  considering Head CT, but Given localized numbness without weakness or deficit not necessary.   MDM - Call placed to Dr. Despina Hidden, on plan of care. Reviewed patient symptoms and patient presentation versus current clinical picture. Also consulted on if Head CT was warranted.  - MD agrees that Head CT not necessary at this time. Suspicion for atypical migraine presentation.  - plan for discharge.  Assessment and Plan   1. Intractable migraine without status migrainosus, unspecified migraine type   2. [redacted] weeks gestation of pregnancy   3. Right facial numbness    - Reviewed worsening signs and return precautions with patient.  - FHT appropriate for gestational age at time of discharge.  - Patient discharged home in stable condition and may return to MAU as needed.   Claudette Head, MSN CNM  07/04/2022, 8:46 PM

## 2022-07-04 NOTE — MAU Note (Signed)
.  Samantha Edwards is a 22 y.o. at [redacted]w[redacted]d here in MAU reporting: transferred from main ED - states she woke up with right half of face feeling numb and lip was twitching. Denies pain with it. Reports she had a HA earlier, but it has went away. Denies ctx, VB, or LOF. +FM  Onset of complaint: 1100 Pain score: 0 Vitals:   07/04/22 1758 07/04/22 1931  BP: 98/60 (!) 103/55  Pulse: 80 85  Resp: 18 17  Temp: 98.2 F (36.8 C) 97.9 F (36.6 C)  SpO2: 100% 99%     FHT:137 Lab orders placed from triage:  UA

## 2022-07-04 NOTE — ED Triage Notes (Signed)
Pt came in via POV d/t waking up this morning with Rt sided facial twitching & lip numbness & has been having Rt sided HA. Also reports she is 8 months pregnant, denies any current pain, A/Ox4.

## 2022-07-07 ENCOUNTER — Other Ambulatory Visit: Payer: Medicaid Other

## 2022-07-07 ENCOUNTER — Other Ambulatory Visit: Payer: Self-pay

## 2022-07-07 DIAGNOSIS — D649 Anemia, unspecified: Secondary | ICD-10-CM

## 2022-07-07 DIAGNOSIS — Z3480 Encounter for supervision of other normal pregnancy, unspecified trimester: Secondary | ICD-10-CM

## 2022-07-07 MED ORDER — PANTOPRAZOLE SODIUM 20 MG PO TBEC: 20.0000 mg | DELAYED_RELEASE_TABLET | Freq: Every day | ORAL | 3 refills | Status: AC

## 2022-07-08 ENCOUNTER — Encounter: Payer: Self-pay | Admitting: Obstetrics and Gynecology

## 2022-07-08 ENCOUNTER — Other Ambulatory Visit: Payer: Self-pay | Admitting: Obstetrics and Gynecology

## 2022-07-08 DIAGNOSIS — D509 Iron deficiency anemia, unspecified: Secondary | ICD-10-CM | POA: Insufficient documentation

## 2022-07-08 DIAGNOSIS — D508 Other iron deficiency anemias: Secondary | ICD-10-CM

## 2022-07-08 LAB — IRON,TIBC AND FERRITIN PANEL
Ferritin: 12 ng/mL — ABNORMAL LOW (ref 15–150)
Iron Saturation: 10 % — ABNORMAL LOW (ref 15–55)
Iron: 62 ug/dL (ref 27–159)
Total Iron Binding Capacity: 593 ug/dL (ref 250–450)
UIBC: 531 ug/dL — ABNORMAL HIGH (ref 131–425)

## 2022-07-08 NOTE — Progress Notes (Signed)
Iron infusion orders placed.

## 2022-07-09 ENCOUNTER — Telehealth: Payer: Self-pay

## 2022-07-09 NOTE — Telephone Encounter (Signed)
Auth Submission: NO AUTH NEEDED Site of care: Site of care: [facility] Payer: Medicaid [employer] Medication & CPT/J Code(s) submitted: Venofer (Iron Sucrose) J1756 Route of submission (phone, fax, portal):  Phone # Fax # Auth type: Buy/Bill Units/visits requested: 500mg  q 2 weeks for 2 doses  Approval from: 07/09/2022 to 11/08/2022

## 2022-07-13 ENCOUNTER — Ambulatory Visit (INDEPENDENT_AMBULATORY_CARE_PROVIDER_SITE_OTHER): Payer: Medicaid Other | Admitting: Certified Nurse Midwife

## 2022-07-13 VITALS — BP 106/67 | HR 97 | Wt 185.5 lb

## 2022-07-13 DIAGNOSIS — Z3493 Encounter for supervision of normal pregnancy, unspecified, third trimester: Secondary | ICD-10-CM

## 2022-07-13 DIAGNOSIS — Z3A35 35 weeks gestation of pregnancy: Secondary | ICD-10-CM

## 2022-07-13 DIAGNOSIS — O99013 Anemia complicating pregnancy, third trimester: Secondary | ICD-10-CM

## 2022-07-13 NOTE — Progress Notes (Unsigned)
Pt reports fetal movement, denies pain.  

## 2022-07-14 ENCOUNTER — Ambulatory Visit (INDEPENDENT_AMBULATORY_CARE_PROVIDER_SITE_OTHER): Payer: Medicaid Other | Admitting: *Deleted

## 2022-07-14 VITALS — BP 95/61 | HR 99 | Temp 98.7°F | Resp 18 | Ht 61.0 in | Wt 185.0 lb

## 2022-07-14 DIAGNOSIS — O99013 Anemia complicating pregnancy, third trimester: Secondary | ICD-10-CM | POA: Diagnosis not present

## 2022-07-14 DIAGNOSIS — Z3A35 35 weeks gestation of pregnancy: Secondary | ICD-10-CM

## 2022-07-14 DIAGNOSIS — D508 Other iron deficiency anemias: Secondary | ICD-10-CM

## 2022-07-14 MED ORDER — IRON SUCROSE 500 MG IVPB - SIMPLE MED
500.0000 mg | Freq: Once | INTRAVENOUS | Status: AC
  Administered 2022-07-14: 500 mg via INTRAVENOUS
  Filled 2022-07-14: qty 500

## 2022-07-14 MED ORDER — ACETAMINOPHEN 325 MG PO TABS
650.0000 mg | ORAL_TABLET | Freq: Once | ORAL | Status: AC
  Administered 2022-07-14: 650 mg via ORAL
  Filled 2022-07-14: qty 2

## 2022-07-14 MED ORDER — DIPHENHYDRAMINE HCL 25 MG PO CAPS
25.0000 mg | ORAL_CAPSULE | Freq: Once | ORAL | Status: AC
  Administered 2022-07-14: 25 mg via ORAL
  Filled 2022-07-14: qty 1

## 2022-07-14 NOTE — Progress Notes (Signed)
Diagnosis: Iron Deficiency Anemia  Provider:  Chilton Greathouse MD  Procedure: IV Infusion  IV Type: Peripheral, IV Location: R Hand  Venofer (Iron Sucrose), Dose: 500 mg  Infusion Start Time: 0923 am  Infusion Stop Time: 1355 pm  Post Infusion IV Care: Observation period completed and Peripheral IV Discontinued  Discharge: Condition: Good, Destination: Home . AVS Provided  Performed by:  Forrest Moron, RN

## 2022-07-14 NOTE — Patient Instructions (Signed)

## 2022-07-15 NOTE — Progress Notes (Signed)
   PRENATAL VISIT NOTE  Subjective:  Samantha Edwards is a 22 y.o. G3P1011 at [redacted]w[redacted]d being seen today for ongoing prenatal care.  She is currently monitored for the following issues for this low-risk pregnancy and has History of depression with suicide attempt; Supervision of other normal pregnancy, antepartum; Positive RPR test; and Iron deficiency anemia on their problem list.  Patient reports no complaints.  Contractions: Not present. Vag. Bleeding: None.  Movement: Present. Denies leaking of fluid.   The following portions of the patient's history were reviewed and updated as appropriate: allergies, current medications, past family history, past medical history, past social history, past surgical history and problem list.   Objective:   Vitals:   07/13/22 1451  BP: 106/67  Pulse: 97  Weight: 185 lb 8 oz (84.1 kg)    Fetal Status: Fetal Heart Rate (bpm): 145 Fundal Height: 35 cm Movement: Present     General:  Alert, oriented and cooperative. Patient is in no acute distress.  Skin: Skin is warm and dry. No rash noted.   Cardiovascular: Normal heart rate noted  Respiratory: Normal respiratory effort, no problems with respiration noted  Abdomen: Soft, gravid, appropriate for gestational age.  Pain/Pressure: Absent     Pelvic: Cervical exam deferred        Extremities: Normal range of motion.  Edema: None  Mental Status: Normal mood and affect. Normal behavior. Normal judgment and thought content.   Assessment and Plan:  Pregnancy: G3P1011 at [redacted]w[redacted]d 1. Encounter for supervision of low-risk pregnancy in third trimester - Patient doing well.  - Reports vigorous and frequent fetal movement.   2. [redacted] weeks gestation of pregnancy - GBS at next visit.  - Fundal height appropriate for gestational age.   3. Anemia during pregnancy in third trimester - Patient has iron transfusion scheduled for tomorrow.  - Plan to reassess Hgb at next visit.   Preterm labor symptoms and  general obstetric precautions including but not limited to vaginal bleeding, contractions, leaking of fluid and fetal movement were reviewed in detail with the patient. Please refer to After Visit Summary for other counseling recommendations.   Return in about 2 weeks (around 07/27/2022) for LOB w GBS.  Future Appointments  Date Time Provider Department Center  07/28/2022  8:45 AM [facility] None  07/29/2022  4:10 PM Corlis Hove, NP [facility] None  08/03/2022  3:30 PM Leftwich-Kirby, Wilmer Floor, CNM [facility] None  08/10/2022  2:30 PM Carlynn Herald, CNM [facility] None  08/17/2022  1:50 PM Leftwich-Kirby, Wilmer Floor, CNM [facility] None  08/24/2022  1:50 PM Lennart Pall, MD [facility] None    Marquest Gunkel Danella Deis) Suzie Portela, MSN, CNM  Center for [facility]  07/15/2022 3:39 AM

## 2022-07-28 ENCOUNTER — Other Ambulatory Visit (HOSPITAL_COMMUNITY)
Admission: RE | Admit: 2022-07-28 | Discharge: 2022-07-28 | Disposition: A | Discharge status: Home / Self Care | Payer: Medicaid Other | Source: Ambulatory Visit | Attending: Student | Admitting: Student

## 2022-07-28 ENCOUNTER — Encounter: Payer: Self-pay | Admitting: Student

## 2022-07-28 ENCOUNTER — Ambulatory Visit (INDEPENDENT_AMBULATORY_CARE_PROVIDER_SITE_OTHER): Payer: Medicaid Other | Admitting: Student

## 2022-07-28 ENCOUNTER — Ambulatory Visit: Payer: Medicaid Other

## 2022-07-28 VITALS — BP 110/70 | HR 84 | Wt 186.2 lb

## 2022-07-28 DIAGNOSIS — Z3493 Encounter for supervision of normal pregnancy, unspecified, third trimester: Secondary | ICD-10-CM

## 2022-07-28 DIAGNOSIS — Z3A37 37 weeks gestation of pregnancy: Secondary | ICD-10-CM

## 2022-07-28 DIAGNOSIS — O99013 Anemia complicating pregnancy, third trimester: Secondary | ICD-10-CM

## 2022-07-28 MED ORDER — IRON SUCROSE 500 MG IVPB - SIMPLE MED
500.0000 mg | Freq: Once | INTRAVENOUS | Status: DC
  Filled 2022-07-28: qty 275

## 2022-07-28 MED ORDER — DIPHENHYDRAMINE HCL 25 MG PO CAPS: 25.0000 mg | ORAL_CAPSULE | Freq: Once | ORAL | Status: DC

## 2022-07-28 MED ORDER — ACETAMINOPHEN 325 MG PO TABS: 650.0000 mg | ORAL_TABLET | Freq: Once | ORAL | Status: DC

## 2022-07-28 NOTE — Progress Notes (Signed)
   PRENATAL VISIT NOTE  Subjective:  Samantha Edwards is a 22 y.o. G3P1011 at [redacted]w[redacted]d being seen today for ongoing prenatal care.  She is currently monitored for the following issues for this low-risk pregnancy and has History of depression with suicide attempt; Supervision of other normal pregnancy, antepartum; Positive RPR test; and Iron deficiency anemia on their problem list.  Patient reports no complaints.  Contractions: Not present. Vag. Bleeding: None.  Movement: Present. Denies leaking of fluid.   The following portions of the patient's history were reviewed and updated as appropriate: allergies, current medications, past family history, past medical history, past social history, past surgical history and problem list.   Objective:   Vitals:   07/28/22 1057  BP: 110/70  Pulse: 84  Weight: 186 lb 3.2 oz (84.5 kg)    Fetal Status: Fetal Heart Rate (bpm): 145   Movement: Present     General:  Alert, oriented and cooperative. Patient is in no acute distress.  Skin: Skin is warm and dry. No rash noted.   Cardiovascular: Normal heart rate noted  Respiratory: Normal respiratory effort, no problems with respiration noted  Abdomen: Soft, gravid, appropriate for gestational age.  Pain/Pressure: Absent     Pelvic: Cervical exam deferred        Extremities: Normal range of motion.  Edema: Trace  Mental Status: Normal mood and affect. Normal behavior. Normal judgment and thought content.   Assessment and Plan:  Pregnancy: G3P1011 at [redacted]w[redacted]d 1. Encounter for supervision of low-risk pregnancy in third trimester - frequent and vigorous fetal movement present  2. [redacted] weeks gestation of pregnancy - GBS collected today   3. Anemia during pregnancy in third trimester - 2/2 iron infusion today   Term labor symptoms and general obstetric precautions including but not limited to vaginal bleeding, contractions, leaking of fluid and fetal movement were reviewed in detail with the  patient. Please refer to After Visit Summary for other counseling recommendations.   No follow-ups on file.  Future Appointments  Date Time Provider Department Center  08/03/2022  3:30 PM Hurshel Party, CNM [facility] None  08/10/2022  2:30 PM Carlynn Herald, CNM [facility] None  08/17/2022  1:50 PM Leftwich-Kirby, Wilmer Floor, CNM [facility] None  08/24/2022  1:50 PM Lennart Pall, MD [facility] None    Corlis Hove, NP

## 2022-07-29 ENCOUNTER — Encounter: Payer: Medicaid Other | Admitting: Student

## 2022-07-30 LAB — CERVICOVAGINAL ANCILLARY ONLY
Chlamydia: NEGATIVE
Comment: NEGATIVE
Comment: NEGATIVE
Comment: NORMAL
Neisseria Gonorrhea: NEGATIVE
Trichomonas: NEGATIVE

## 2022-07-31 LAB — CULTURE, BETA STREP (GROUP B ONLY): Strep Gp B Culture: POSITIVE — AB

## 2022-08-02 ENCOUNTER — Encounter: Payer: Self-pay | Admitting: Obstetrics and Gynecology

## 2022-08-03 ENCOUNTER — Encounter: Payer: Medicaid Other | Admitting: Advanced Practice Midwife

## 2022-08-09 ENCOUNTER — Encounter: Payer: Self-pay | Admitting: Student

## 2022-08-09 DIAGNOSIS — O99820 Streptococcus B carrier state complicating pregnancy: Secondary | ICD-10-CM | POA: Insufficient documentation

## 2022-08-10 ENCOUNTER — Encounter: Payer: Medicaid Other | Admitting: Certified Nurse Midwife

## 2022-08-10 DIAGNOSIS — O99013 Anemia complicating pregnancy, third trimester: Secondary | ICD-10-CM

## 2022-08-10 DIAGNOSIS — Z3493 Encounter for supervision of normal pregnancy, unspecified, third trimester: Secondary | ICD-10-CM

## 2022-08-10 DIAGNOSIS — Z3A39 39 weeks gestation of pregnancy: Secondary | ICD-10-CM

## 2022-08-14 ENCOUNTER — Inpatient Hospital Stay (EMERGENCY_DEPARTMENT_HOSPITAL)
Admission: AD | Admit: 2022-08-14 | Discharge: 2022-08-14 | Disposition: A | Discharge status: Home / Self Care | Payer: Medicaid Other | Source: Home / Self Care | Attending: Family Medicine | Admitting: Family Medicine

## 2022-08-14 ENCOUNTER — Inpatient Hospital Stay (HOSPITAL_COMMUNITY): Admission: AD | Admit: 2022-08-14 | Payer: Medicaid Other | Source: Home / Self Care | Admitting: Family Medicine

## 2022-08-14 ENCOUNTER — Encounter (HOSPITAL_COMMUNITY): Payer: Self-pay | Admitting: Family Medicine

## 2022-08-14 DIAGNOSIS — O99344 Other mental disorders complicating childbirth: Secondary | ICD-10-CM | POA: Diagnosis not present

## 2022-08-14 DIAGNOSIS — O99820 Streptococcus B carrier state complicating pregnancy: Secondary | ICD-10-CM

## 2022-08-14 DIAGNOSIS — Z30017 Encounter for initial prescription of implantable subdermal contraceptive: Secondary | ICD-10-CM

## 2022-08-14 DIAGNOSIS — O99824 Streptococcus B carrier state complicating childbirth: Secondary | ICD-10-CM | POA: Diagnosis present

## 2022-08-14 DIAGNOSIS — Z3A39 39 weeks gestation of pregnancy: Secondary | ICD-10-CM

## 2022-08-14 DIAGNOSIS — O471 False labor at or after 37 completed weeks of gestation: Secondary | ICD-10-CM

## 2022-08-14 DIAGNOSIS — Z3689 Encounter for other specified antenatal screening: Secondary | ICD-10-CM

## 2022-08-14 DIAGNOSIS — Z3480 Encounter for supervision of other normal pregnancy, unspecified trimester: Secondary | ICD-10-CM

## 2022-08-14 DIAGNOSIS — O26893 Other specified pregnancy related conditions, third trimester: Secondary | ICD-10-CM | POA: Diagnosis present

## 2022-08-14 LAB — TYPE AND SCREEN
ABO/RH(D): A POS
Antibody Screen: NEGATIVE

## 2022-08-14 LAB — CBC
HCT: 40.7 % (ref 36.0–46.0)
Hemoglobin: 12.8 g/dL (ref 12.0–15.0)
MCH: 26.5 pg (ref 26.0–34.0)
MCHC: 31.4 g/dL (ref 30.0–36.0)
MCV: 84.3 fL (ref 80.0–100.0)
Platelets: 236 10*3/uL (ref 150–400)
RBC: 4.83 MIL/uL (ref 3.87–5.11)
RDW: 21.7 % — ABNORMAL HIGH (ref 11.5–15.5)
WBC: 12.4 10*3/uL — ABNORMAL HIGH (ref 4.0–10.5)
nRBC: 0 % (ref 0.0–0.2)

## 2022-08-14 MED ORDER — LIDOCAINE HCL (PF) 1 % IJ SOLN
30.0000 mL | INTRAMUSCULAR | Status: DC | PRN
  Administered 2022-08-15: 30 mL via SUBCUTANEOUS
  Filled 2022-08-14: qty 30

## 2022-08-14 MED ORDER — LACTATED RINGERS IV SOLN: INTRAVENOUS | Status: DC

## 2022-08-14 MED ORDER — OXYTOCIN BOLUS FROM INFUSION
333.0000 mL | Freq: Once | INTRAVENOUS | Status: AC
  Administered 2022-08-15: 333 mL via INTRAVENOUS

## 2022-08-14 MED ORDER — SODIUM CHLORIDE 0.9 % IV SOLN
2.0000 g | Freq: Once | INTRAVENOUS | Status: AC
  Administered 2022-08-14: 2 g via INTRAVENOUS
  Filled 2022-08-14: qty 2000

## 2022-08-14 MED ORDER — OXYTOCIN-SODIUM CHLORIDE 30-0.9 UT/500ML-% IV SOLN
2.5000 [IU]/h | INTRAVENOUS | Status: DC
  Administered 2022-08-15: 2.5 [IU]/h via INTRAVENOUS
  Filled 2022-08-14: qty 500

## 2022-08-14 MED ORDER — SOD CITRATE-CITRIC ACID 500-334 MG/5ML PO SOLN: 30.0000 mL | ORAL | Status: DC | PRN

## 2022-08-14 MED ORDER — LACTATED RINGERS IV SOLN: 500.0000 mL | INTRAVENOUS | Status: DC | PRN

## 2022-08-14 MED ORDER — SODIUM CHLORIDE 0.9 % IV SOLN: 2.0000 g | Freq: Four times a day (QID) | INTRAVENOUS | Status: DC

## 2022-08-14 MED ORDER — SODIUM CHLORIDE 0.9 % IV SOLN: 1.0000 g | INTRAVENOUS | Status: DC

## 2022-08-14 MED ORDER — ACETAMINOPHEN 325 MG PO TABS
650.0000 mg | ORAL_TABLET | ORAL | Status: DC | PRN
  Administered 2022-08-15: 650 mg via ORAL
  Filled 2022-08-14: qty 2

## 2022-08-14 MED ORDER — ONDANSETRON HCL 4 MG/2ML IJ SOLN: 4.0000 mg | Freq: Four times a day (QID) | INTRAMUSCULAR | Status: DC | PRN

## 2022-08-14 MED ORDER — PENICILLIN G POT IN DEXTROSE 60000 UNIT/ML IV SOLN: 3.0000 10*6.[IU] | INTRAVENOUS | Status: DC

## 2022-08-14 MED ORDER — SODIUM CHLORIDE 0.9 % IV SOLN
5.0000 10*6.[IU] | Freq: Once | INTRAVENOUS | Status: DC
  Filled 2022-08-14: qty 5

## 2022-08-14 NOTE — MAU Provider Note (Signed)
S: Ms. Samantha Edwards is a 22 y.o. G3P1011 at [redacted]w[redacted]d  who presents to MAU today for labor evaluation.   Nurse reports cervix remains unchanged after evaluation and patient agreeable to discharge.   Cervical exam by RN:  Dilation: 1.5 Effacement (%): 60 Cervical Position: Posterior Station: -2 Presentation: Vertex Exam by:: DCallaway, RN  Fetal Monitoring: Baseline: 135 Variability: Moderate Accelerations: Present Decelerations: Variable x 1 at beginning of monitoring Contractions: Q4-43min  MDM Discussed patient with RN. NST reviewed.   A: SIUP at [redacted]w[redacted]d  False labor Cat I FT  P: Discharge home Labor precautions and kick counts included in AVS Patient to follow-up with primary office as scheduled  Patient may return to MAU as needed or when in labor   Gerrit Heck, [place] 08/14/2022 6:44 AM

## 2022-08-14 NOTE — Progress Notes (Signed)
Samantha Edwards is a 22 y.o. G3P1011 at [redacted]w[redacted]d by LMP admitted for active labor  Subjective: Feeling pressure  Objective: BP (!) 141/93   Pulse (!) 107   Temp 98.1 F (36.7 C)   Resp 18   Ht 5\' 1"  (1.549 m)   Wt 86.2 kg   LMP 11/21/2021   SpO2 99%   BMI 35.90 kg/m  No intake/output data recorded. No intake/output data recorded.  FHT:  FHR: 140 bpm, variability: moderate,  accelerations:   ,  decelerations:  Present prolonged x 1, responded to position change including Knee chest UC:   regular, every 2-3 minutes SVE:   9/cervix only on right side Labs: Lab Results  Component Value Date   WBC 12.4 (H) 08/14/2022   HGB 12.8 08/14/2022   HCT 40.7 08/14/2022   MCV 84.3 08/14/2022   PLT 236 08/14/2022    Assessment / Plan: Spontaneous labor, progressing normally  Labor: Progressing normally Preeclampsia:  no signs or symptoms of toxicity Fetal Wellbeing:  Category II Pain Control:  Nitrous Oxide I/D:   s/p Amp x 1 Anticipated MOD:  NSVD  Reva Bores, MD 08/14/2022, 11:18 PM

## 2022-08-14 NOTE — H&P (Signed)
OBSTETRIC ADMISSION HISTORY AND PHYSICAL  Samantha Edwards is a 22 y.o. female G3P1011 with IUP at [redacted]w[redacted]d by 12wk [place] presenting for SOL. She reports +FMs, No LOF, no VB, no blurry vision, headaches or peripheral edema, and RUQ pain.  She plans on breast and bottle feeding. She request depo for birth control. 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   Suicide attempt by drug ingestion (HCC) 11/08/2016    Past Surgical History: Past Surgical History:  Procedure Laterality Date   CHOLECYSTECTOMY  06/2021    Obstetrical History: OB History     Gravida  3   Para  1   Term  1   Preterm      AB  1   Living  1      SAB  1   IAB      Ectopic      Multiple      Live Births  1           Social History Social History   Socioeconomic History   Marital status: Significant Other    Spouse name: Not on file   Number of children: Not on file   Years of education: Not on file   Highest education level: Not on file  Occupational History   Not on file  Tobacco Use   Smoking status: Never    Passive exposure: Never   Smokeless tobacco: Never  Vaping Use   Vaping status: Never Used  Substance and Sexual Activity   Alcohol use: Not Currently    Comment: not since confirmed pregnancy   Drug use: Not Currently    Types: Marijuana    Comment: not since confirmed pregnancy   Sexual activity: Not Currently    Partners: Male    Birth control/protection: None  Other Topics Concern   Not on file  Social History Narrative   ** Merged History Encounter **       Social Determinants of Health   Financial  Resource Strain: Low Risk  (06/30/2021)   Received from [facility]   Overall Financial Resource Strain (CARDIA)    Difficulty of Paying Living Expenses: Not hard at all  Food Insecurity: No Food Insecurity (05/20/2021)   Received from [facility]   Hunger Vital Sign    Worried About Running Out of Food in the Last Year: Never true    Ran Out of Food in the Last Year: Never true  Transportation Needs: Not on file  Physical Activity: Insufficiently Active (05/20/2021)   Received [facility]   Exercise Vital Sign    Days of Exercise per Week: 3 days    Minutes of Exercise per Session: 10 min  Stress: No Stress Concern Present (06/29/2021)   Received from [facility] - Occupational Stress Questionnaire    Feeling of Stress : Not at all  Social Connections: Unknown (05/31/2022)   Received from [facility]   Social Network    Social Network: Not on file    Family History: Family History  Problem Relation Age of Onset   Diabetes Neg Hx  Hypertension Neg Hx     Allergies: No Known Allergies  Facility-Administered Medications Prior to Admission  Medication Dose Route Frequency Provider Last Rate Last Admin   pantoprazole (PROTONIX) EC tablet 20 mg  20 mg Oral Daily Conan Bowens, MD       Medications Prior to Admission  Medication Sig Dispense Refill Last Dose   Blood Pressure Monitoring (BLOOD PRESSURE KIT) DEVI 1 kit by Does not apply route once a week. 1 each 0    pantoprazole (PROTONIX) 20 MG tablet Take 1 tablet (20 mg total) by mouth daily. 30 tablet 3    Prenatal Vit-Fe Fumarate-FA (PRENATAL VITAMINS) 28-0.8 MG TABS Take 1 tablet by mouth daily. 60 tablet 3      Review of Systems   All systems reviewed and negative except as stated in HPI  Blood pressure (!) 141/93, pulse (!) 107, temperature 98.1 F (36.7 C), resp. rate 18, height 5\' 1"  (1.549 m), weight 86.2 kg, last menstrual period 11/21/2021, SpO2  99%. General appearance: alert, cooperative, and appears stated age Lungs: clear to auscultation bilaterally Heart: regular rate and rhythm Abdomen: soft, non-tender; bowel sounds normal Pelvic: Dilation: 6.5 Effacement (%): 80 Cervical Position: Middle Station: -2 Presentation: Vertex Exam by:: Isabel Caprice, RN Extremities: Homans sign is negative, no sign of DVT Presentation: cephalic Fetal monitoringBaseline: 145 bpm, Variability: Good {> 6 bpm), Accelerations: Reactive, and Decelerations: Absent Uterine activityq 2-3 mins  Prenatal labs: ABO, Rh: --/--/PENDING (07/27 2147) Antibody: PENDING (07/27 2147) Rubella: 2.12 (01/19 1150) RPR: Non Reactive (05/08 0914)  HBsAg: Negative (01/19 1150)  HIV: Non Reactive (05/08 0914)  GBS: Positive/-- (07/10 1207)  2 hr Glucola 75/117/49 Genetic screening  Low Risk NIPT female Anatomy US WNL  Prenatal Transfer Tool  Maternal Diabetes: No Genetic Screening: Normal Maternal Ultrasounds/Referrals: Normal Fetal Ultrasounds or other Referrals:  None Maternal Substance Abuse:  No Significant Maternal Medications:  Meds include: Protonix Significant Maternal Lab Results:  Group B Strep positive Number of Prenatal Visits:greater than 3 verified prenatal visits Other Comments:  None  Results for orders placed or performed during the hospital encounter of 08/14/22 (from the past 24 hour(s))  CBC   Collection Time: 08/14/22  9:47 PM  Result Value Ref Range   WBC 12.4 (H) 4.0 - 10.5 K/uL   RBC 4.83 3.87 - 5.11 MIL/uL   Hemoglobin 12.8 12.0 - 15.0 g/dL   HCT 25.9 56.3 - 87.5 %   MCV 84.3 80.0 - 100.0 fL   MCH 26.5 26.0 - 34.0 pg   MCHC 31.4 30.0 - 36.0 g/dL   RDW 64.3 (H) 32.9 - 51.8 %   Platelets 236 150 - 400 K/uL   nRBC 0.0 0.0 - 0.2 %  Type and screen [facility]   Collection Time: 08/14/22  9:47 PM  Result Value Ref Range   ABO/RH(D) PENDING    Antibody Screen PENDING    Sample Expiration       08/17/2022,2359 Performed at [facility], [address], [place], [place] [address]     Patient Active Problem List   Diagnosis Date Noted   Indication for care in labor and delivery, antepartum 08/14/2022   Group B Streptococcus carrier, +RV culture, currently pregnant 08/09/2022   Iron deficiency anemia 07/08/2022   Positive RPR test 02/23/2022   Supervision of other normal pregnancy, antepartum 02/05/2022   History of depression with suicide attempt 11/08/2016    Assessment/Plan:  Samantha Edwards is a 23 y.o.  K1S0109 at [redacted]w[redacted]d here forSOL  #Labor:Active #Pain: expectant #FWB: Category 1 #ID:  GBS Pos - given Amp x 1 #MOF: Breast and Bottle #MOC:Depo #Circ:  declines  Reva Bores, MD  08/14/2022, 10:25 PM

## 2022-08-14 NOTE — MAU Note (Signed)
.  Samantha Edwards is a 22 y.o. at [redacted]w[redacted]d here in MAU reporting: ctx all day have gotten worse . Was in MAU this morning and was sent home 1.5 cm dilated. Stated she thinks her mucus plug has come out and maybe some liquid is leaking out. Good fetal movement felt.  LMP:  Onset of complaint: today Pain score: 9 Vitals:   08/14/22 2100  BP: 118/65  Pulse: 92  Resp: 18  Temp: 98.1 F (36.7 C)     FHT:144  Lab orders placed from triage:  labor eval

## 2022-08-14 NOTE — MAU Note (Signed)
Pt says UC's - strong since 0100- 8/10 [facility]- [facility]  VE- she missed last weeks appointment  Denies HSV GBS- neg

## 2022-08-15 ENCOUNTER — Other Ambulatory Visit: Payer: Self-pay

## 2022-08-15 ENCOUNTER — Encounter (HOSPITAL_COMMUNITY): Payer: Self-pay | Admitting: Family Medicine

## 2022-08-15 ENCOUNTER — Inpatient Hospital Stay (HOSPITAL_COMMUNITY): Admission: AD | Admit: 2022-08-15 | Payer: Medicaid Other | Source: Home / Self Care

## 2022-08-15 DIAGNOSIS — O99820 Streptococcus B carrier state complicating pregnancy: Secondary | ICD-10-CM

## 2022-08-15 DIAGNOSIS — Z3A39 39 weeks gestation of pregnancy: Secondary | ICD-10-CM

## 2022-08-15 DIAGNOSIS — O99344 Other mental disorders complicating childbirth: Secondary | ICD-10-CM

## 2022-08-15 LAB — RPR: RPR Ser Ql: NONREACTIVE

## 2022-08-15 MED ORDER — OXYCODONE HCL 5 MG PO TABS: 10.0000 mg | ORAL_TABLET | ORAL | Status: DC | PRN

## 2022-08-15 MED ORDER — ONDANSETRON HCL 4 MG/2ML IJ SOLN: 4.0000 mg | INTRAMUSCULAR | Status: DC | PRN

## 2022-08-15 MED ORDER — ZOLPIDEM TARTRATE 5 MG PO TABS: 5.0000 mg | ORAL_TABLET | Freq: Every evening | ORAL | Status: DC | PRN

## 2022-08-15 MED ORDER — IBUPROFEN 600 MG PO TABS
600.0000 mg | ORAL_TABLET | Freq: Four times a day (QID) | ORAL | Status: DC
  Administered 2022-08-15 – 2022-08-16 (×6): 600 mg via ORAL
  Filled 2022-08-15 (×5): qty 1

## 2022-08-15 MED ORDER — MEASLES, MUMPS & RUBELLA VAC IJ SOLR: 0.5000 mL | Freq: Once | INTRAMUSCULAR | Status: DC

## 2022-08-15 MED ORDER — ACETAMINOPHEN 325 MG PO TABS: 650.0000 mg | ORAL_TABLET | ORAL | Status: DC | PRN

## 2022-08-15 MED ORDER — SIMETHICONE 80 MG PO CHEW: 80.0000 mg | CHEWABLE_TABLET | ORAL | Status: DC | PRN

## 2022-08-15 MED ORDER — WITCH HAZEL-GLYCERIN EX PADS: 1.0000 | MEDICATED_PAD | CUTANEOUS | Status: DC | PRN

## 2022-08-15 MED ORDER — BENZOCAINE-MENTHOL 20-0.5 % EX AERO
1.0000 | INHALATION_SPRAY | CUTANEOUS | Status: DC | PRN
  Administered 2022-08-15: 1 via TOPICAL
  Filled 2022-08-15: qty 56

## 2022-08-15 MED ORDER — DIPHENHYDRAMINE HCL 25 MG PO CAPS: 25.0000 mg | ORAL_CAPSULE | Freq: Four times a day (QID) | ORAL | Status: DC | PRN

## 2022-08-15 MED ORDER — TETANUS-DIPHTH-ACELL PERTUSSIS 5-2.5-18.5 LF-MCG/0.5 IM SUSY: 0.5000 mL | PREFILLED_SYRINGE | Freq: Once | INTRAMUSCULAR | Status: DC

## 2022-08-15 MED ORDER — PRENATAL MULTIVITAMIN CH
1.0000 | ORAL_TABLET | Freq: Every day | ORAL | Status: DC
  Administered 2022-08-15 – 2022-08-16 (×2): 1 via ORAL
  Filled 2022-08-15: qty 1

## 2022-08-15 MED ORDER — SENNOSIDES-DOCUSATE SODIUM 8.6-50 MG PO TABS: 2.0000 | ORAL_TABLET | Freq: Every day | ORAL | Status: DC

## 2022-08-15 MED ORDER — COCONUT OIL OIL: 1.0000 | TOPICAL_OIL | Status: DC | PRN

## 2022-08-15 MED ORDER — ONDANSETRON HCL 4 MG PO TABS: 4.0000 mg | ORAL_TABLET | ORAL | Status: DC | PRN

## 2022-08-15 MED ORDER — MEDROXYPROGESTERONE ACETATE 150 MG/ML IM SUSP: 150.0000 mg | INTRAMUSCULAR | Status: DC | PRN

## 2022-08-15 MED ORDER — OXYCODONE HCL 5 MG PO TABS: 5.0000 mg | ORAL_TABLET | ORAL | Status: DC | PRN

## 2022-08-15 MED ORDER — DIBUCAINE (PERIANAL) 1 % EX OINT: 1.0000 | TOPICAL_OINTMENT | CUTANEOUS | Status: DC | PRN

## 2022-08-15 NOTE — Lactation Note (Signed)
This note was copied from a baby's chart. Lactation Consultation Note  Patient Name: Boy Anoush Plagman NWGNF'A Date: 08/15/2022 Age:21 hours Reason for consult: Initial assessment;Term;Primapara Mom was pulling her breast back into her bra when LC came into rm. Mom stated he just finished BF. Mom stated he BF for 20 minutes, denied pain. Encouraged to un-swaddle for feedings. Newborn feeding habits, STS, I&O, positioning, support during feeding reviewed. Mom encouraged to feed baby 8-12 times/24 hours and with feeding cues.  Mom stated he is eating a lot. Mom is holding ice on her abdomin d/t cramping. Answered mom's questions. Mom stated she is very tired because he is wanting to eat a lot. Encouraged to rest between feedings.  Encouraged to call for assistance as needed.  Maternal Data Has patient been taught Hand Expression?: Yes Does the patient have breastfeeding experience prior to this delivery?: No  Feeding Mother's Current Feeding Choice: Breast Milk and Formula  LATCH Score Latch:  (LC didn't see latch)     Type of Nipple: Everted at rest and after stimulation  Comfort (Breast/Nipple): Soft / non-tender         Lactation Tools Discussed/Used    Interventions Interventions: Breast feeding basics reviewed;Support pillows;Position options;Education;Breast massage;Hand express;LC Services brochure;Breast compression  Discharge    Consult Status Consult Status: Follow-up Date: 08/16/22 Follow-up type: In-patient    Charyl Dancer 08/15/2022, 9:59 PM

## 2022-08-15 NOTE — Plan of Care (Signed)
Problem: Education: Goal: Knowledge of General Education information will improve Description: Including pain rating scale, medication(s)/side effects and non-pharmacologic comfort measures 08/15/2022 0800 by Donne Hazel, LPN Outcome: Progressing 08/15/2022 0758 by Donne Hazel, LPN Outcome: Progressing   Problem: Health Behavior/Discharge Planning: Goal: Ability to manage health-related needs will improve 08/15/2022 0800 by Donne Hazel, LPN Outcome: Progressing 08/15/2022 0758 by Donne Hazel, LPN Outcome: Progressing   Problem: Clinical Measurements: Goal: Ability to maintain clinical measurements within normal limits will improve 08/15/2022 0800 by Donne Hazel, LPN Outcome: Progressing 08/15/2022 0758 by Donne Hazel, LPN Outcome: Progressing Goal: Will remain free from infection 08/15/2022 0800 by Donne Hazel, LPN Outcome: Progressing 08/15/2022 0758 by Donne Hazel, LPN Outcome: Progressing Goal: Diagnostic test results will improve 08/15/2022 0800 by Donne Hazel, LPN Outcome: Progressing 08/15/2022 0758 by Donne Hazel, LPN Outcome: Progressing Goal: Respiratory complications will improve 08/15/2022 0800 by Donne Hazel, LPN Outcome: Progressing 08/15/2022 0758 by Donne Hazel, LPN Outcome: Progressing Goal: Cardiovascular complication will be avoided 08/15/2022 0800 by Donne Hazel, LPN Outcome: Progressing 08/15/2022 0758 by Donne Hazel, LPN Outcome: Progressing   Problem: Activity: Goal: Risk for activity intolerance will decrease 08/15/2022 0800 by Donne Hazel, LPN Outcome: Progressing 08/15/2022 0758 by Donne Hazel, LPN Outcome: Progressing   Problem: Nutrition: Goal: Adequate nutrition will be maintained 08/15/2022 0800 by Donne Hazel, LPN Outcome: Progressing 08/15/2022 0758 by Donne Hazel, LPN Outcome: Progressing   Problem: Coping: Goal: Level of anxiety will decrease 08/15/2022 0800 by Donne Hazel, LPN Outcome:  Progressing 08/15/2022 0758 by Donne Hazel, LPN Outcome: Progressing   Problem: Elimination: Goal: Will not experience complications related to bowel motility 08/15/2022 0800 by Donne Hazel, LPN Outcome: Progressing 08/15/2022 0758 by Donne Hazel, LPN Outcome: Progressing Goal: Will not experience complications related to urinary retention 08/15/2022 0800 by Donne Hazel, LPN Outcome: Progressing 08/15/2022 0758 by Donne Hazel, LPN Outcome: Progressing   Problem: Pain Managment: Goal: General experience of comfort will improve 08/15/2022 0800 by Donne Hazel, LPN Outcome: Progressing 08/15/2022 0758 by Donne Hazel, LPN Outcome: Progressing   Problem: Safety: Goal: Ability to remain free from injury will improve 08/15/2022 0800 by Donne Hazel, LPN Outcome: Progressing 08/15/2022 0758 by Donne Hazel, LPN Outcome: Progressing   Problem: Skin Integrity: Goal: Risk for impaired skin integrity will decrease 08/15/2022 0800 by Donne Hazel, LPN Outcome: Progressing 08/15/2022 0758 by Donne Hazel, LPN Outcome: Progressing   Problem: Education: Goal: Knowledge of condition will improve 08/15/2022 0800 by Donne Hazel, LPN Outcome: Progressing 08/15/2022 0758 by Donne Hazel, LPN Outcome: Progressing Goal: Individualized Educational Video(s) 08/15/2022 0800 by Donne Hazel, LPN Outcome: Progressing 08/15/2022 0758 by Donne Hazel, LPN Outcome: Progressing Goal: Individualized Newborn Educational Video(s) 08/15/2022 0800 by Donne Hazel, LPN Outcome: Progressing 08/15/2022 0758 by Donne Hazel, LPN Outcome: Progressing   Problem: Activity: Goal: Will verbalize the importance of balancing activity with adequate rest periods 08/15/2022 0800 by Donne Hazel, LPN Outcome: Progressing 08/15/2022 0758 by Donne Hazel, LPN Outcome: Progressing Goal: Ability to tolerate increased activity will improve 08/15/2022 0800 by Donne Hazel, LPN Outcome:  Progressing 08/15/2022 0758 by Donne Hazel, LPN Outcome: Progressing   Problem: Coping: Goal: Ability to identify and utilize available resources and services will improve 08/15/2022 0800 by Donne Hazel, LPN Outcome: Progressing 08/15/2022 0758 by Adam Phenix  A, LPN Outcome: Progressing   Problem: Life Cycle: Goal: Chance of risk for complications during the postpartum period will decrease 08/15/2022 0800 by Donne Hazel, LPN Outcome: Progressing 08/15/2022 0758 by Donne Hazel, LPN Outcome: Progressing   Problem: Role Relationship: Goal: Ability to demonstrate positive interaction with newborn will improve 08/15/2022 0800 by Donne Hazel, LPN Outcome: Progressing 08/15/2022 0758 by Donne Hazel, LPN Outcome: Progressing   Problem: Skin Integrity: Goal: Demonstration of wound healing without infection will improve 08/15/2022 0800 by Donne Hazel, LPN Outcome: Progressing 08/15/2022 0758 by Donne Hazel, LPN Outcome: Progressing

## 2022-08-15 NOTE — Discharge Summary (Signed)
Postpartum Discharge Summary  Date of Service updated***     Patient Name: Samantha Edwards DOB: October 12, 2000 MRN: [id number]  Date of admission: 08/14/2022 Delivery date:08/15/2022 Delivering provider: Reva Bores Date of discharge: 08/15/2022  Admitting diagnosis: Indication for care in labor and delivery, antepartum [O75.9] Intrauterine pregnancy: [redacted]w[redacted]d     Secondary diagnosis:  Principal Problem:   Indication for care in labor and delivery, antepartum  Additional problems: None    Discharge diagnosis: Term Pregnancy Delivered                                              Post partum procedures: none Augmentation: N/A Complications: None  Hospital course: Onset of Labor With Vaginal Delivery      22 y.o. yo G3P1011 at [redacted]w[redacted]d was admitted in Active Labor on 08/14/2022. Labor course was complicated by nothing, used nitrous . Membrane Rupture Time/Date: 11:15 PM,08/14/2022  Delivery Method:Vaginal, Spontaneous Episiotomy:  None Lacerations:  1st degree;Periurethral Patient had a postpartum course complicated by ***.  She is ambulating, tolerating a regular diet, passing flatus, and urinating well. Patient is discharged home in stable condition on 08/15/22.  Newborn Data: Birth date:08/15/2022 Birth time:12:05 AM Gender:Female Living status:Living Apgars: ,  Weight:   Magnesium Sulfate received: No BMZ received: No Rhophylac:N/A MMR:N/A T-DaP:Given prenatally Flu: N/A Transfusion:No  Physical exam  Vitals:   08/14/22 2125 08/14/22 2130 08/14/22 2216 08/14/22 2316  BP:   (!) 141/93   Pulse:   (!) 107   Resp:   18   Temp:      SpO2: 99% 99%  100%  Weight:      Height:       General: alert, cooperative, and no distress Lochia: appropriate Uterine Fundus: firm DVT Evaluation: No evidence of DVT seen on physical exam. Labs: Lab Results  Component Value Date   WBC 12.4 (H) 08/14/2022   HGB 12.8 08/14/2022   HCT 40.7 08/14/2022   MCV 84.3 08/14/2022    PLT 236 08/14/2022      Latest Ref Rng & Units 07/04/2022    4:15 PM  CMP  Glucose 70 - 99 mg/dL 78   BUN 6 - 20 mg/dL <3   Creatinine 3.87 - 1.00 mg/dL 5.64   Sodium 332 - 951 mmol/L 136   Potassium 3.5 - 5.1 mmol/L 4.0   Chloride 98 - 111 mmol/L 104    Edinburgh Score:     No data to display           After visit meds:  Allergies as of 08/15/2022   No Known Allergies   Med Rec must be completed prior to using this [facility]***        Discharge home in stable condition Infant Feeding: Bottle and Breast Infant Disposition:{CHL IP OB HOME WITH OACZYS:06301} Discharge instruction: per After Visit Summary and Postpartum booklet. Activity: Advance as tolerated. Pelvic rest for 6 weeks.  Diet: routine diet Future Appointments: Future Appointments  Date Time Provider Department Center  08/17/2022  1:50 PM Hurshel Party, CNM [facility] None  08/24/2022  1:50 PM Lennart Pall, MD [facility] None   Follow up Visit:  Follow-up Information     [facility] Follow up.   Specialty: Obstetrics and Gynecology Contact information: [address], [address] [place] [address] [phone]  Msg sent by Gildardo Griffes Please schedule this patient for a Virtual postpartum visit in 4 weeks with the following provider: Any provider. Additional Postpartum F/U:Postpartum Depression checkup  Low risk pregnancy complicated by:  none Delivery mode:  Vaginal, Spontaneous Anticipated Birth Control:  Depo   08/15/2022 Reva Bores, MD

## 2022-08-15 NOTE — Plan of Care (Signed)

## 2022-08-16 ENCOUNTER — Encounter: Payer: Self-pay | Admitting: Obstetrics and Gynecology

## 2022-08-16 ENCOUNTER — Other Ambulatory Visit (HOSPITAL_COMMUNITY): Payer: Self-pay

## 2022-08-16 DIAGNOSIS — Z30017 Encounter for initial prescription of implantable subdermal contraceptive: Secondary | ICD-10-CM

## 2022-08-16 MED ORDER — LIDOCAINE HCL 1 % IJ SOLN
0.0000 mL | Freq: Once | INTRAMUSCULAR | Status: AC | PRN
  Administered 2022-08-16: 20 mL via INTRADERMAL
  Filled 2022-08-16: qty 20

## 2022-08-16 MED ORDER — ACETAMINOPHEN 325 MG PO TABS
650.0000 mg | ORAL_TABLET | ORAL | 0 refills | Status: AC | PRN
  Filled 2022-08-16: qty 100, 9d supply, fill #0

## 2022-08-16 MED ORDER — DOCUSATE SODIUM 100 MG PO CAPS
100.0000 mg | ORAL_CAPSULE | Freq: Two times a day (BID) | ORAL | 0 refills | Status: AC | PRN
  Filled 2022-08-16: qty 100, 50d supply, fill #0

## 2022-08-16 MED ORDER — ETONOGESTREL 68 MG ~~LOC~~ IMPL
68.0000 mg | DRUG_IMPLANT | Freq: Once | SUBCUTANEOUS | Status: AC
  Administered 2022-08-16: 68 mg via SUBCUTANEOUS
  Filled 2022-08-16: qty 1

## 2022-08-16 MED ORDER — IBUPROFEN 600 MG PO TABS
600.0000 mg | ORAL_TABLET | Freq: Four times a day (QID) | ORAL | 0 refills | Status: AC
Start: 2022-08-16 — End: ?
  Filled 2022-08-16: qty 30, 8d supply, fill #0

## 2022-08-16 NOTE — Procedures (Signed)
Nexplanon Insertion Procedure  Patient identified, informed consent performed, consent signed.   Patient does understand that irregular bleeding is a very common side effect of this medication. She was advised to have backup contraception for one week after placement. Appropriate time out taken.  Patient's left arm was prepped and draped in the usual sterile fashion.. The ruler used to measure and mark insertion area.  Patient was prepped with alcohol swab and then injected with 3 ml of 1% lidocaine.  She was prepped with betadine, Nexplanon removed from packaging,  Device confirmed in needle, then inserted full length of needle and withdrawn per handbook instructions. Nexplanon was able to palpated in the patient's arm; patient palpated the insert herself. There was minimal blood loss.  Patient insertion site covered with guaze and a pressure bandage to reduce any bruising.  The patient tolerated the procedure well and was given post procedure instructions.

## 2022-08-16 NOTE — Social Work (Signed)
MOB was referred for history of depression.  * Referral screened out by Clinical Social Worker because none of the following criteria appear to apply:  ~ History of anxiety/depression during this pregnancy, or of post-partum depression following prior delivery.  ~ Diagnosis of anxiety and/or depression within last 3 years OR * MOB's symptoms currently being treated with medication and/or therapy.  Per chart review MOB diagnosis dates back to 2018. Per OB records MOB reported that "her mood has been good and she has had no issues with depression." No concerns noted during pregnancy.  Please contact the Clinical Social Worker if needs arise, or by MOB request.  Wende Neighbors, LCSWA Clinical Social Worker [phone]

## 2022-08-16 NOTE — Progress Notes (Addendum)
POSTPARTUM PROGRESS NOTE  Subjective: Rus Krishnaswamy is a 22 y.o. [id number] s/p SVD at [redacted]w[redacted]d.  She reports she is doing well. No acute events overnight. She denies any problems with ambulating, voiding or po intake. Denies nausea or vomiting. She has passed flatus. Pain is well controlled.  Lochia is appropriate.  Objective: Blood pressure (!) 106/46, pulse 88, temperature 98.5 F (36.9 C), temperature source Oral, resp. rate 19, height 5\' 1"  (1.549 m), weight 86.2 kg, last menstrual period 11/21/2021, SpO2 98%, unknown if currently breastfeeding.  Physical Exam:  General: alert, cooperative and no distress Chest: no respiratory distress Abdomen: soft, non-tender  Uterine Fundus: firm and at level of umbilicus Extremities: No calf swelling or tenderness  no LE edema  Recent Labs    08/14/22 2147  HGB 12.8  HCT 40.7    Assessment/Plan: Alexine Hallam is a 23 y.o. [id number] s/p SVD at [redacted]w[redacted]d.  Routine Postpartum Care: Doing well, pain well-controlled.  -- Continue routine care, lactation support  -- Contraception: depo -- Feeding: breast and bottle  Dispo: Plan for discharge PPD#2, continue routine postpartum care.  Para March, DO Faculty Practice, [facility] [employer] 08/16/2022 7:45 AM

## 2022-08-17 ENCOUNTER — Encounter: Payer: Medicaid Other | Admitting: Advanced Practice Midwife

## 2022-08-18 ENCOUNTER — Encounter: Payer: Medicaid Other | Admitting: Obstetrics and Gynecology

## 2022-08-24 ENCOUNTER — Encounter: Payer: Medicaid Other | Admitting: Obstetrics and Gynecology

## 2022-08-31 IMAGING — US US ABDOMEN COMPLETE
1 series · 13 of 25 positions shown · non-contrast
Comparison: Ultrasound 04/06/2021

CLINICAL DATA: Pain, vomiting

EXAM:
ABDOMEN ULTRASOUND COMPLETE

[Series 1: us abdomen complete · 13 of 137 slices shown]
[im 1/137]
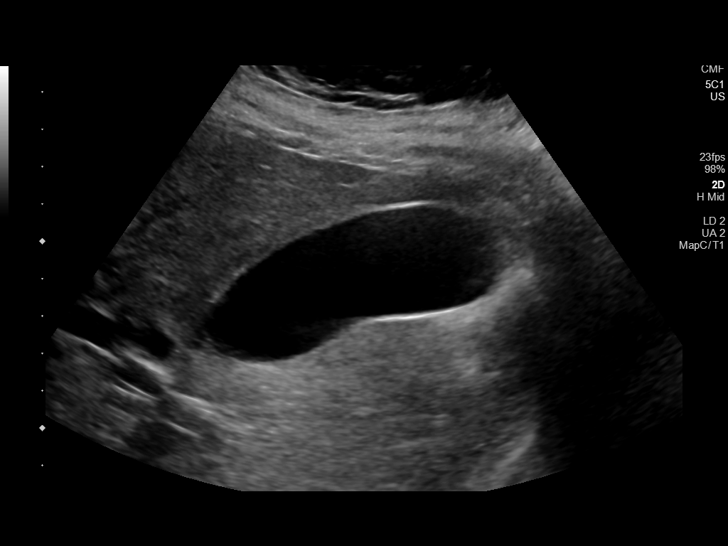
[im 12/137]
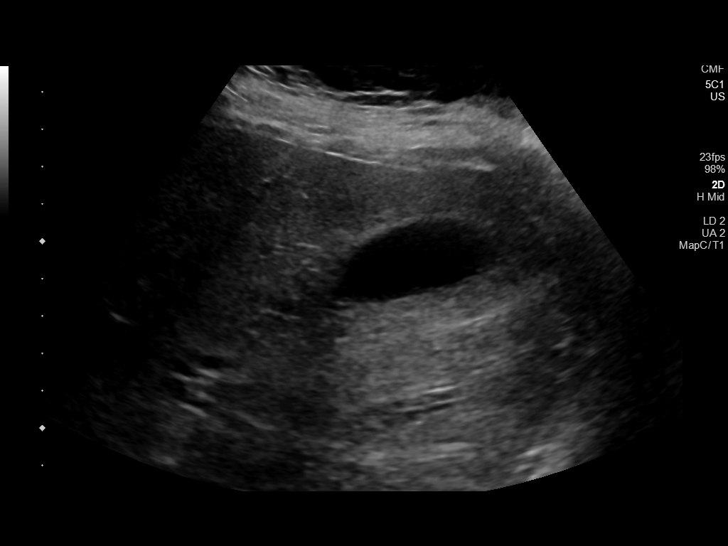
[im 23/137]
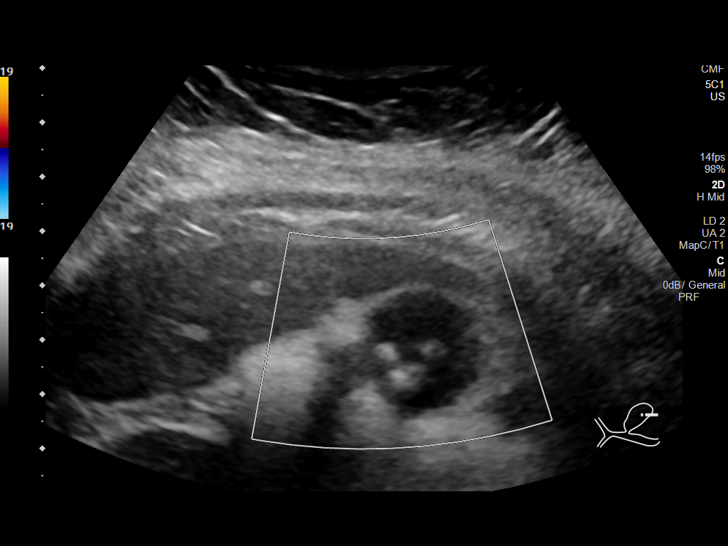
[im 35/137]
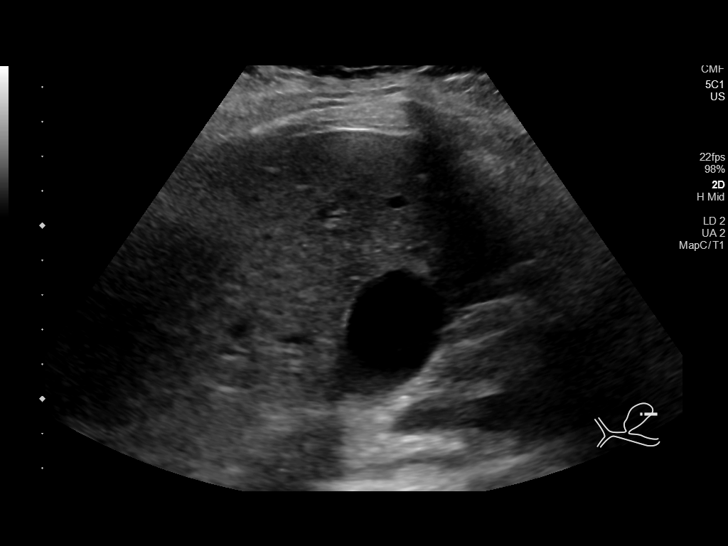
[im 46/137]
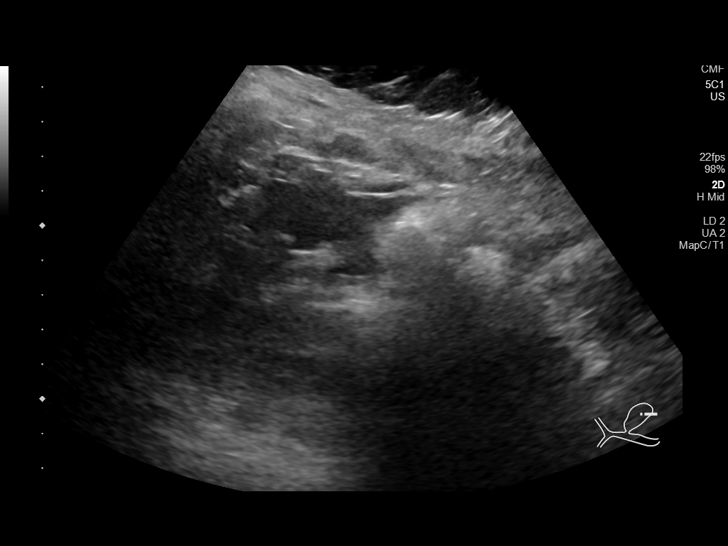
[im 57/137]
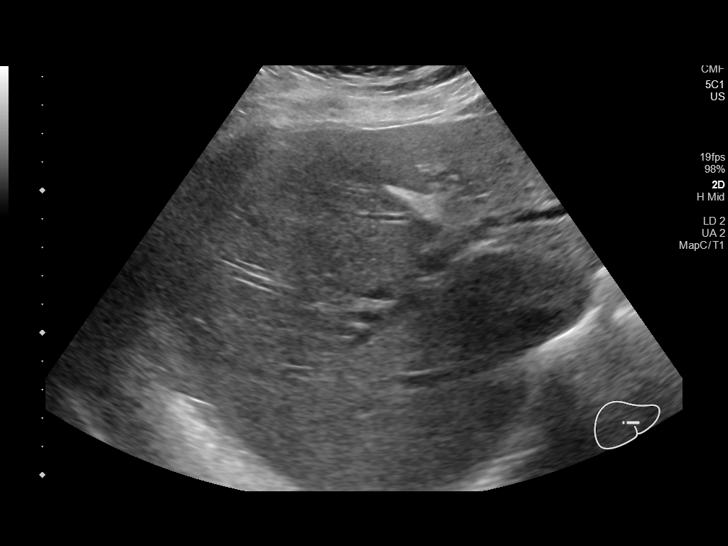
[im 69/137]
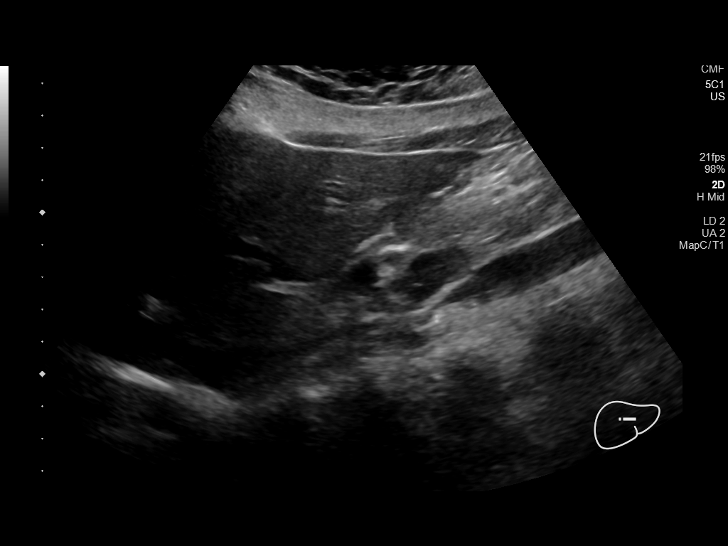
[im 80/137]
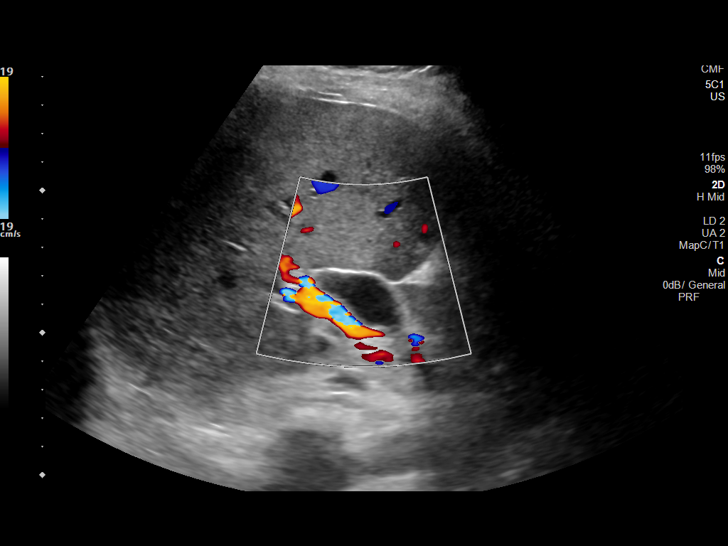
[im 91/137]
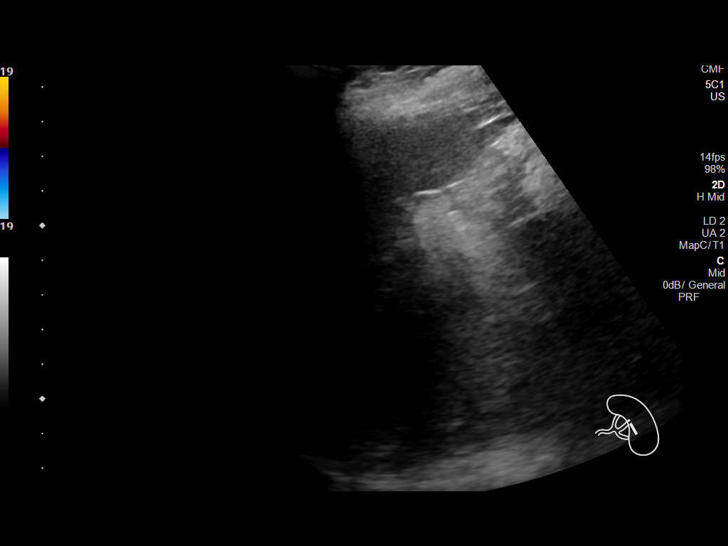
[im 103/137]
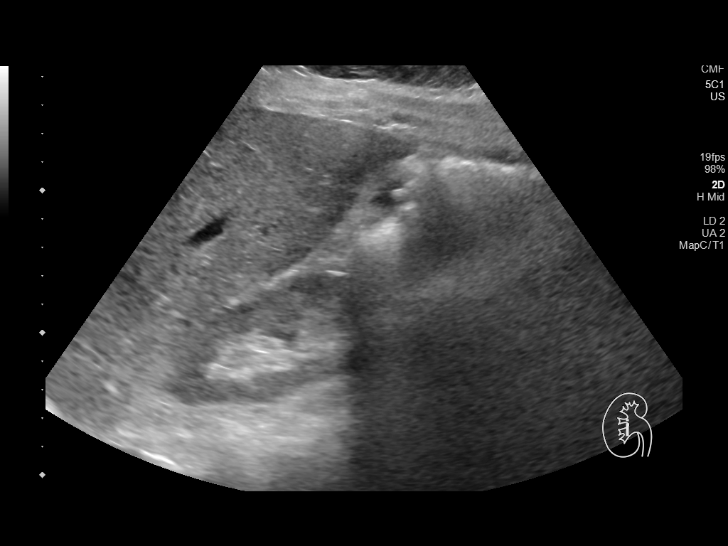
[im 114/137]
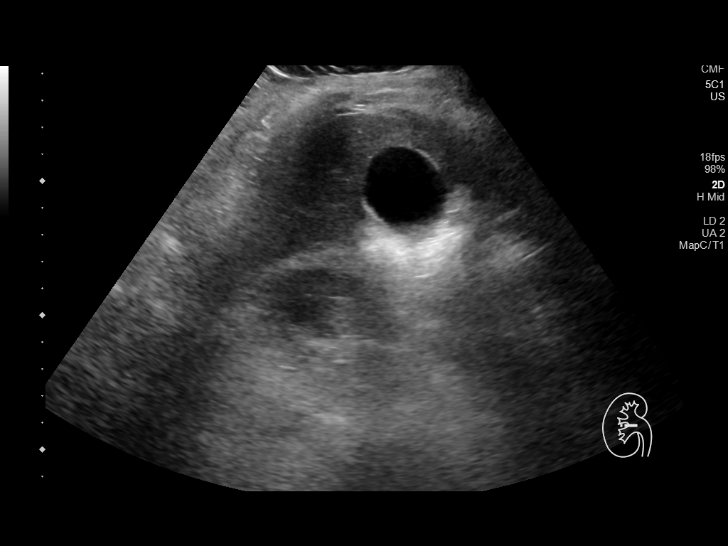
[im 125/137]
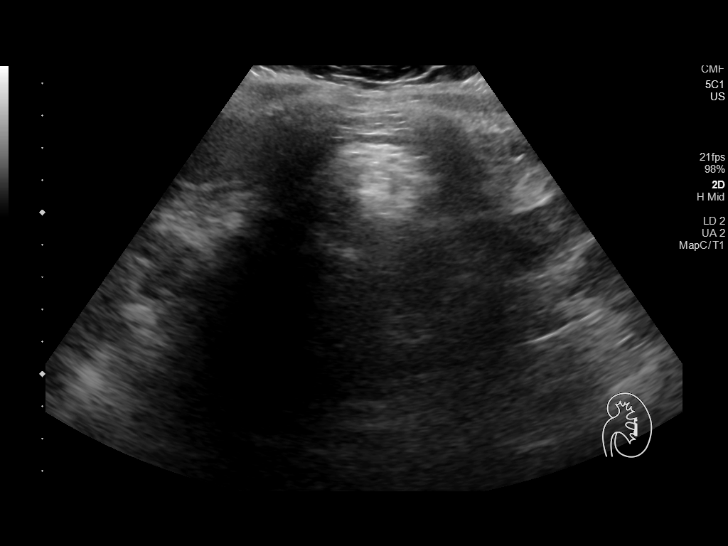
[im 137/137]
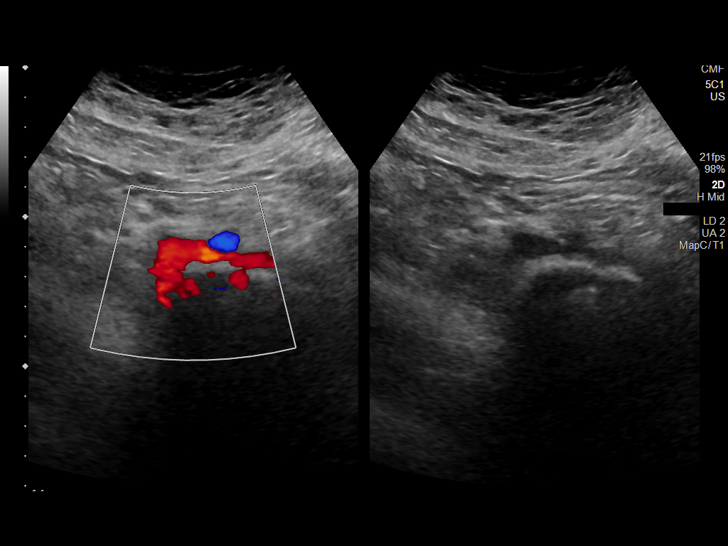

[13 of 25 positions shown; findings below may reference images not displayed]

FINDINGS: Gallbladder: There are multiple intraluminal gallstones, largest
measuring up to 6 mm. The gallbladder is mildly distended. No wall
thickening or pericholecystic fluid. A positive sonographic Murphy
sign is reported by the sonographer.

Common bile duct: Diameter: 14 mm, abnormal. No intraductal
obstructing lesion identified.

Liver: No focal lesion identified. Within normal limits in
parenchymal echogenicity. Portal vein is patent on color Doppler
imaging with normal direction of blood flow towards the liver.

IVC: No abnormality visualized.

Pancreas: Visualized portion unremarkable.

Spleen: Size and appearance within normal limits.

Right Kidney: Length: 9.4. Echogenicity within normal limits. No
mass or hydronephrosis visualized.

Left Kidney: Length: 8 point. Echogenicity within normal limits. No
mass or hydronephrosis visualized.

Abdominal aorta: No aneurysm visualized.

Other findings: Limited exam due to patient position and pain.
IMPRESSION: Dilated common bile duct measuring up to 14 mm without intraductal
obstructing lesion visible sonographically. There are gallstones
within the gallbladder. Recommend correlation with LFTs for findings
suggesting biliary obstruction and consideration of MRCP/ERCP.

A positive sonographic Murphy sign was reported by the sonographer,
however the gallbladder is only mildly distended and there is no
wall thickening or pericholecystic fluid. Acute cholecystitis is
unlikely based on the sonographic appearance.

## 2022-09-13 ENCOUNTER — Telehealth (HOSPITAL_COMMUNITY): Payer: Self-pay | Admitting: *Deleted

## 2022-09-13 NOTE — Telephone Encounter (Signed)
09/13/2022  Name: Cathern Crotts MRN: [id number] DOB: [birth year]  Reason for Call:  Transition of Care Hospital Discharge Call  Contact Status: Patient Contact Status: Complete  Language assistant needed: Interpreter Mode: Interpreter Not Needed        Follow-Up Questions: Do You Have Any Concerns About Your Health As You Heal From Delivery?: No Do You Have Any Concerns About Your Infants Health?: No  Edinburgh Postnatal Depression Scale:  In the Past 7 Days: I have been able to laugh and see the funny side of things.: As much as I always could I have looked forward with enjoyment to things.: As much as I ever did I have blamed myself unnecessarily when things went wrong.: Not very often I have been anxious or worried for no good reason.: No, not at all I have felt scared or panicky for no good reason.: No, not much Things have been getting on top of me.: No, I have been coping as well as ever I have been so unhappy that I have had difficulty sleeping.: Not at all I have felt sad or miserable.: No, not at all I have been so unhappy that I have been crying.: No, never The thought of harming myself has occurred to me.: Never Inocente Salles Postnatal Depression Scale Total: 2  PHQ2-9 Depression Scale:     Discharge Follow-up: Edinburgh score requires follow up?: No Patient was advised of the following resources:: Breastfeeding Support Group, Support Group Declines postpartum group information via email Post-discharge interventions: Reviewed Newborn Safe Sleep Practices  Salena Saner, RN 09/13/2022 14:11

## 2022-09-16 ENCOUNTER — Encounter: Payer: Self-pay | Admitting: Obstetrics and Gynecology

## 2022-09-16 ENCOUNTER — Ambulatory Visit: Payer: Medicaid Other | Admitting: Obstetrics and Gynecology

## 2022-09-16 ENCOUNTER — Telehealth (INDEPENDENT_AMBULATORY_CARE_PROVIDER_SITE_OTHER): Payer: Medicaid Other | Admitting: Obstetrics and Gynecology

## 2022-09-16 NOTE — Progress Notes (Signed)
Provider location: [facility] for [facility]   Patient location: Home  I connected with patient on 09/16/22 at 10:35 AM EDT by Mychart Video Encounter and verified that I am speaking with the correct person using two identifiers.       I discussed the limitations, risks, security and privacy concerns of performing an evaluation and management service virtually and the availability of in person appointments. I also discussed with the patient that there may be a patient responsible charge related to this service. The patient expressed understanding and agreed to proceed.  Post Partum Visit Note Subjective:   Samantha Edwards is a 22 y.o. [phone] female who presents for a postpartum visit. She is 4 weeks postpartum following a normal spontaneous vaginal delivery.  I have fully reviewed the prenatal and intrapartum course. The delivery was at 40 gestational weeks.  Anesthesia: local. Postpartum course has been uncomplicated. Baby is doing well. Baby is feeding by bottle - Enfamil Nuropro . Bleeding no bleeding. Bowel function is normal. Bladder function is normal. Patient is not sexually active. Contraception method is Nexplanon.   Postpartum depression screening: negative.   The pregnancy intention screening data noted above was reviewed. Potential methods of contraception were discussed. The patient elected to proceed with No data recorded.   Edinburgh Postnatal Depression Scale - 09/16/22 1022       Edinburgh Postnatal Depression Scale:  In the Past 7 Days   I have been able to laugh and see the funny side of things. 0    I have looked forward with enjoyment to things. 0    I have blamed myself unnecessarily when things went wrong. 0    I have been anxious or worried for no good reason. 0    I have felt scared or panicky for no good reason. 0    Things have been getting on top of me. 0    I have been so unhappy that I have had difficulty sleeping. 0    I have felt  sad or miserable. 0    I have been so unhappy that I have been crying. 0    The thought of harming myself has occurred to me. 0    Edinburgh Postnatal Depression Scale Total 0                Review of Systems Pertinent items noted in HPI and remainder of comprehensive ROS otherwise negative.  Objective:  LMP 11/21/2021     General:  Alert, oriented and cooperative. Patient is in no acute distress.  Respiratory: Normal respiratory effort, no problems with respiration noted  Mental Status: Normal mood and affect. Normal behavior. Normal judgment and thought content.  Rest of physical exam deferred due to type of encounter   Assessment:    Normal postpartum exam.  Plan:  Essential components of care per ACOG recommendations:  1.  Mood and well being: Patient with negative depression screening today. Reviewed local resources for support.  - Patient does not use tobacco.  - hx of drug use? No    2. Infant care and feeding:  -Patient currently breastmilk feeding? No  -Social determinants of health (SDOH) reviewed in EPIC. No concerns  3. Sexuality, contraception and birth spacing - Patient does not want a pregnancy in the next year.  Desired family size is 2 children.  - Reviewed forms of contraception in tiered fashion. Patient has Nexplanon in place- placed postpartum - Discussed birth spacing of 18 months  4. Sleep and fatigue -Encouraged family/partner/community support of 4 hrs of uninterrupted sleep to help with mood and fatigue  5. Physical Recovery  - Discussed patients delivery and complications - Patient had a 1st  degree laceration, perineal healing reviewed. Patient expressed understanding - Patient has urinary incontinence? No  - Patient is safe to resume physical and sexual activity  6.  Health Maintenance - Last pap smear done 02/2022 and was normal  - Patient to come in for Gardasil vaccine    I provided 15 minutes of face-to-face time during this  encounter.    Return in 1 year (on 09/16/2023).  No future appointments.   Catalina Antigua, MD [facility] [employer], [facility]

## 2022-11-02 ENCOUNTER — Other Ambulatory Visit: Payer: Self-pay
